# Patient Record
Sex: Male | Born: 1950 | Race: White | Hispanic: No | Marital: Married | Smoking: Never smoker
Health system: Southern US, Community
[De-identification: ages and names within clinical notes are randomized; demographics above are authoritative.]

## PROBLEM LIST (undated history)

## (undated) DIAGNOSIS — N200 Calculus of kidney: Secondary | ICD-10-CM

---

## 2002-07-01 ENCOUNTER — Encounter: Payer: Self-pay | Admitting: Urology

## 2002-07-01 ENCOUNTER — Ambulatory Visit (HOSPITAL_BASED_OUTPATIENT_CLINIC_OR_DEPARTMENT_OTHER): Admission: RE | Admit: 2002-07-01 | Discharge: 2002-07-01 | Payer: Self-pay | Admitting: Urology

## 2005-06-04 ENCOUNTER — Encounter: Admission: RE | Admit: 2005-06-04 | Discharge: 2005-06-04 | Payer: Self-pay | Admitting: Internal Medicine

## 2006-08-27 IMAGING — US US MD EVAL AND MANAGEMENT - NRPT [facility]
1 series · 13 of 16 positions shown · non-contrast
Comparison: none

[Series 1: unknown · 13 of 55 slices shown]
[im 1/55]
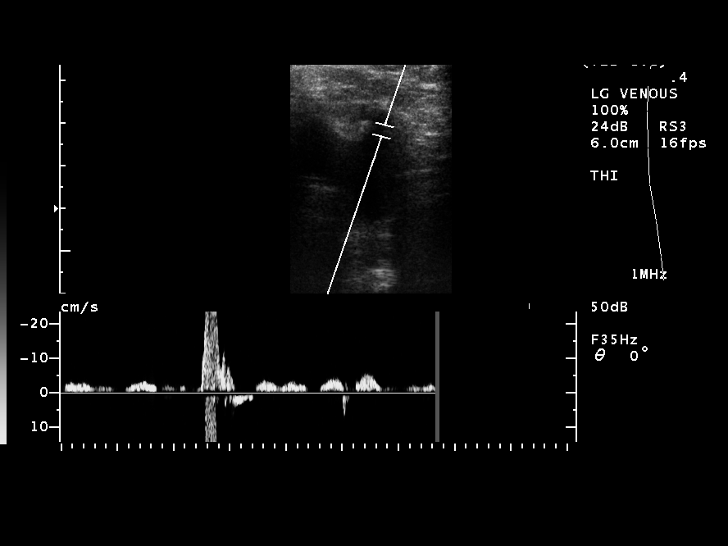
[im 4/55]
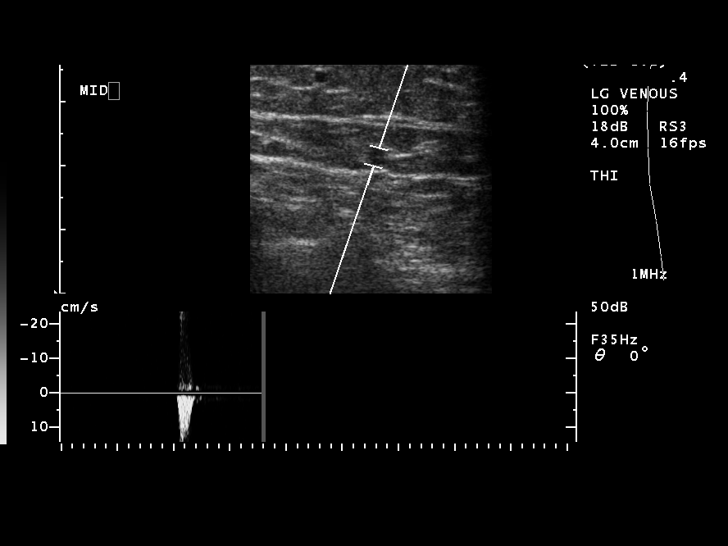
[im 11/55]
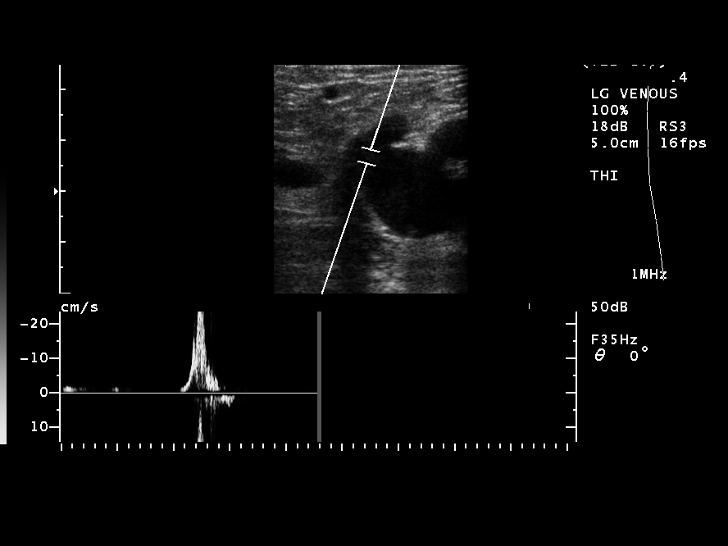
[im 15/55]
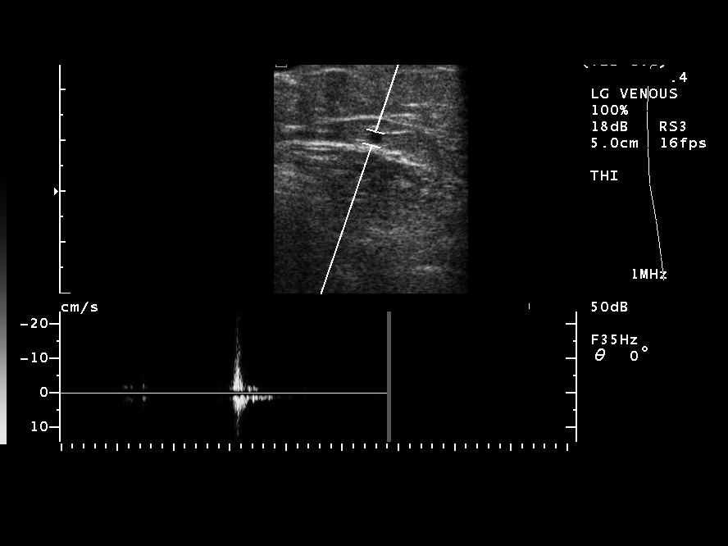
[im 19/55]
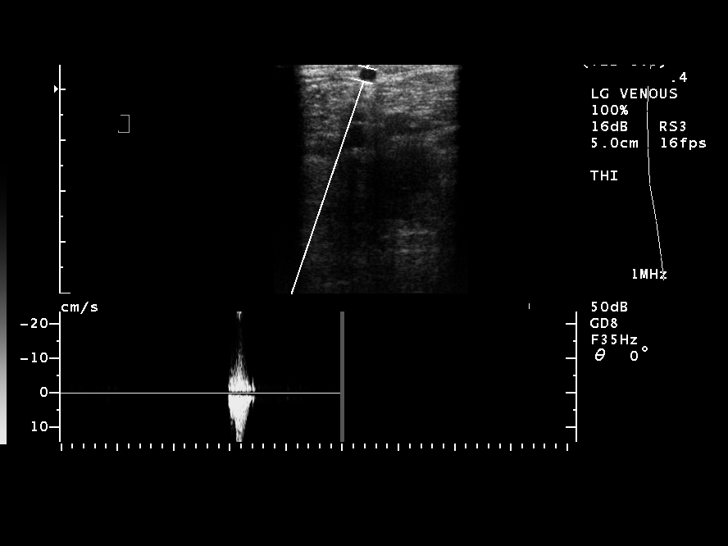
[im 22/55]
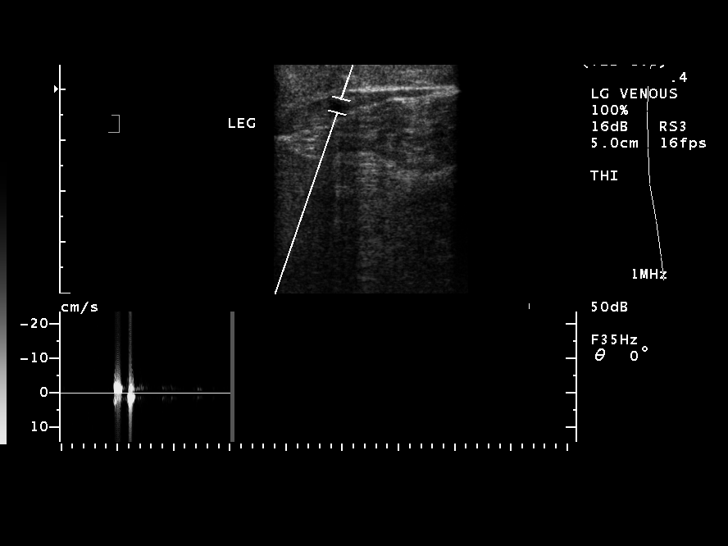
[im 29/55]
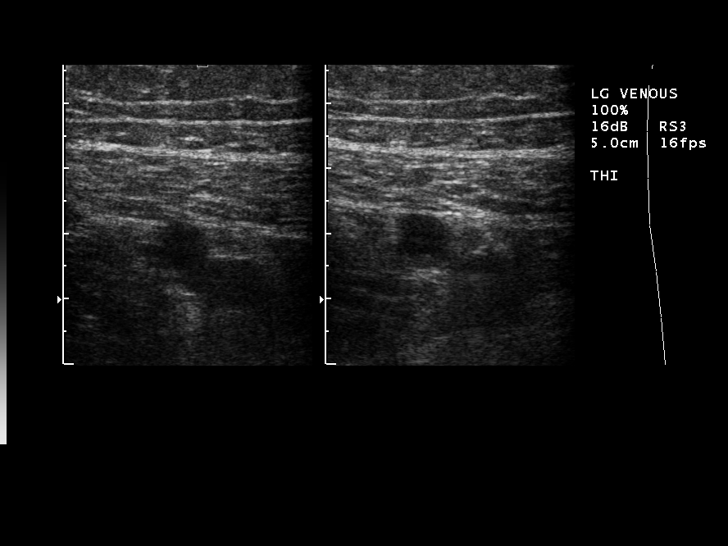
[im 33/55]
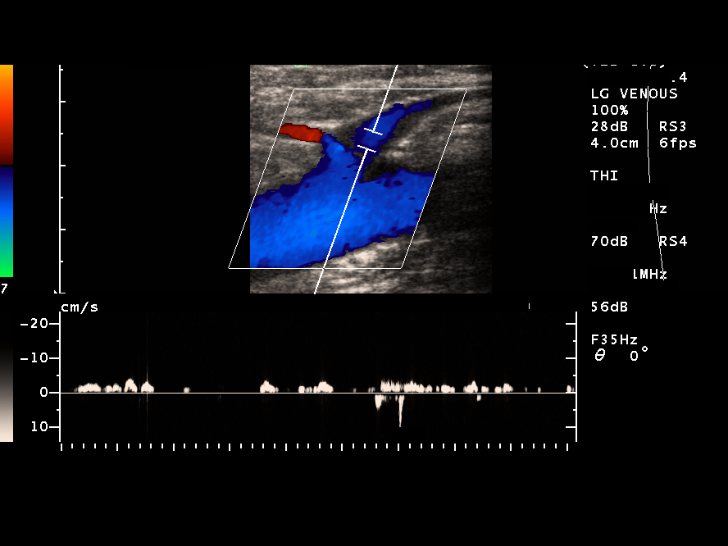
[im 37/55]
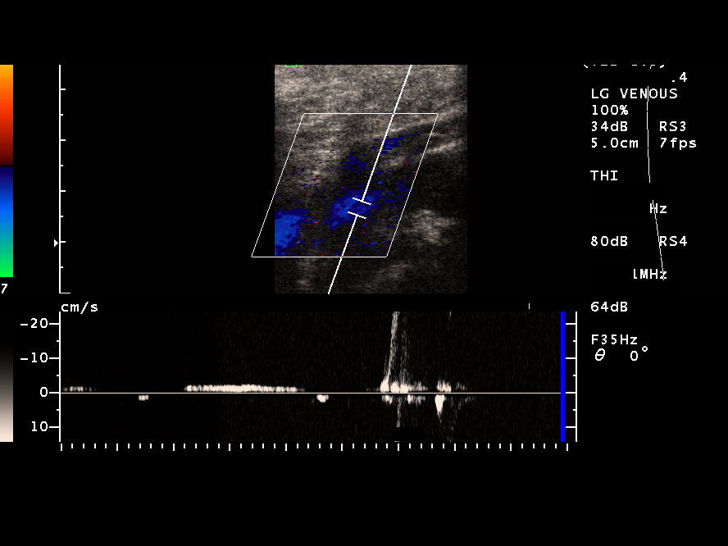
[im 40/55]
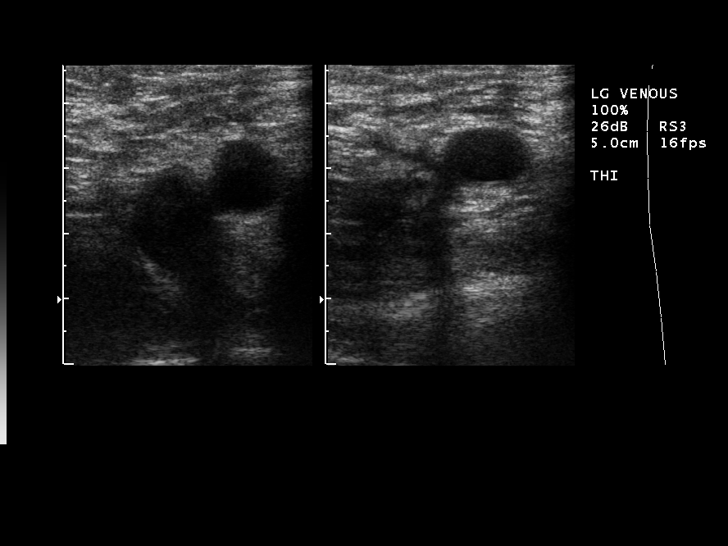
[im 44/55]
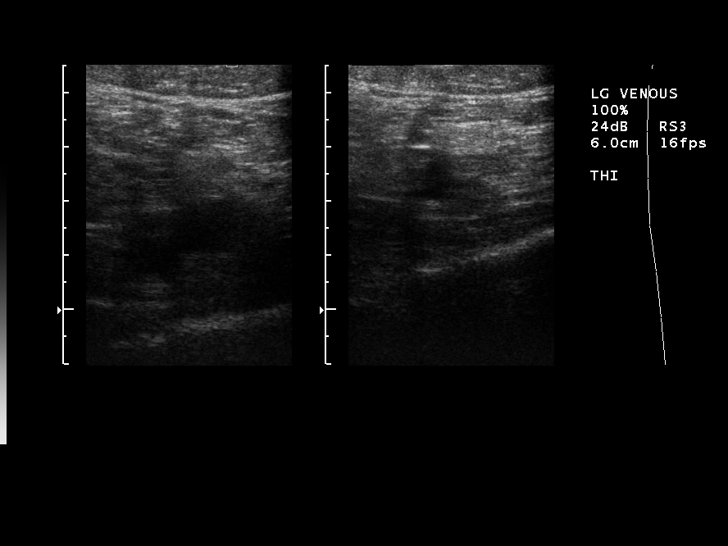
[im 51/55]
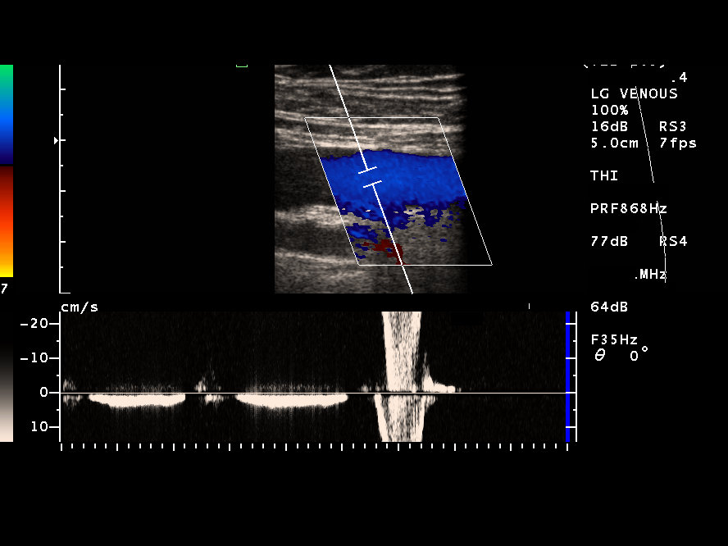
[im 55/55]
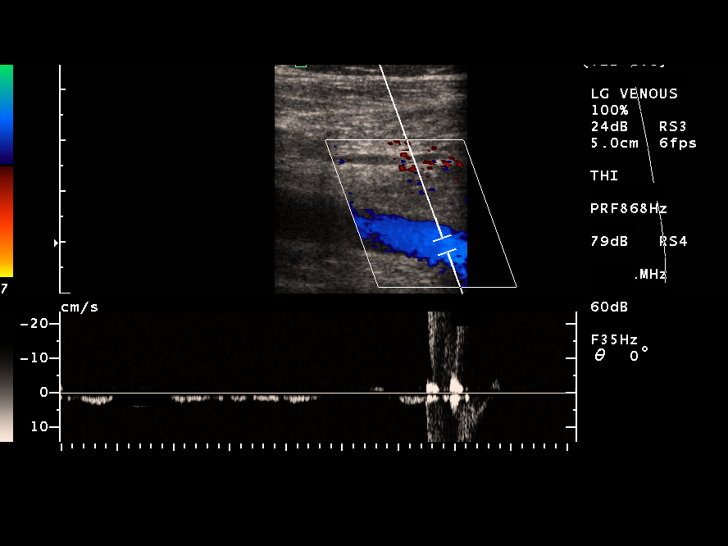

[13 of 16 positions shown; findings below may reference images not displayed]

OUTPATIENT CONSULTATION ? LEVEL II - [DATE]

 Dorys Graber, M.D.
 [REDACTED] [REDACTED]
 [address]

 RE:  Mattoussi Neguez (DOB ? 03/09/1951)

 Dear Dr. Gouwa Arend: 

 Thank you for your referral of Mr. Posavi to me for evaluation of possible treatment of lower extremity varicose veins.  As you know, Mrs. Abolore is a 54 year old male with a several year history of worsening varicose veins, lower extremity pain, and tiredness.  These symptoms are worse in the left leg.  Patient states that he does stand a lot for his job which worsens his symptoms, particularly in the medial distal left thigh and posterior to the left knee.  

 On physical exam, the patient does have moderate sized varicose veins within the medial left thigh and calf as well as posterior to the left knee.  Smaller varicose veins noted within the right medial calf and posterior to the right knee.  There are large bilateral spider veins within the medial thighs, calves, and around the ankles.  

 The patient underwent bilateral lower extremity venous ultrasound today.  This was dictated in a separate report.  In short, this shows truncal reflex only within a very short segment of right great saphenous vein below the right knee.  Otherwise no evidence of saphenous vein reflux.  The varicose veins in the left leg appear to arise from multiple separate perforators from the deep venous system.  

 I have discussed these findings in depth with the patient.  Due to the very limited truncal reflex, the patient is not a good candidate for endovenous laser ablation.  I did offer ultrasound guided sclerotherapy for the patient?s varicose veins, particularly in the left leg.  The patient wishes to hold off at this time.  If his symptoms worsen, he may opt for the treatment in the future.  I did give the patient a prescription for 20 ? 30mm Hg graduated compression hose which he may try in the near future. 

 Again, thank you for your referral of Mr. Abolore to me for evaluation for lower extremity venous insufficiency.  If I can be of further assistance please do not hesitate to contact me. 

 I spent approximately 30 minutes in direct consultation with the patient. 

 Abolore:Yashe, Funmi

## 2016-01-16 DIAGNOSIS — N200 Calculus of kidney: Secondary | ICD-10-CM | POA: Diagnosis not present

## 2016-01-16 DIAGNOSIS — R1031 Right lower quadrant pain: Secondary | ICD-10-CM | POA: Diagnosis not present

## 2016-04-23 DIAGNOSIS — H401212 Low-tension glaucoma, right eye, moderate stage: Secondary | ICD-10-CM | POA: Diagnosis not present

## 2016-04-23 DIAGNOSIS — H26493 Other secondary cataract, bilateral: Secondary | ICD-10-CM | POA: Diagnosis not present

## 2016-04-23 DIAGNOSIS — H401222 Low-tension glaucoma, left eye, moderate stage: Secondary | ICD-10-CM | POA: Diagnosis not present

## 2016-05-15 DIAGNOSIS — N200 Calculus of kidney: Secondary | ICD-10-CM | POA: Diagnosis not present

## 2016-05-15 DIAGNOSIS — N401 Enlarged prostate with lower urinary tract symptoms: Secondary | ICD-10-CM | POA: Diagnosis not present

## 2016-05-15 DIAGNOSIS — R3912 Poor urinary stream: Secondary | ICD-10-CM | POA: Diagnosis not present

## 2016-08-14 DIAGNOSIS — I10 Essential (primary) hypertension: Secondary | ICD-10-CM | POA: Diagnosis not present

## 2016-08-14 DIAGNOSIS — Z125 Encounter for screening for malignant neoplasm of prostate: Secondary | ICD-10-CM | POA: Diagnosis not present

## 2016-08-14 DIAGNOSIS — E784 Other hyperlipidemia: Secondary | ICD-10-CM | POA: Diagnosis not present

## 2016-08-14 DIAGNOSIS — E038 Other specified hypothyroidism: Secondary | ICD-10-CM | POA: Diagnosis not present

## 2016-08-21 DIAGNOSIS — Z6836 Body mass index (BMI) 36.0-36.9, adult: Secondary | ICD-10-CM | POA: Diagnosis not present

## 2016-08-21 DIAGNOSIS — N200 Calculus of kidney: Secondary | ICD-10-CM | POA: Diagnosis not present

## 2016-08-21 DIAGNOSIS — I10 Essential (primary) hypertension: Secondary | ICD-10-CM | POA: Diagnosis not present

## 2016-08-21 DIAGNOSIS — J342 Deviated nasal septum: Secondary | ICD-10-CM | POA: Diagnosis not present

## 2016-08-21 DIAGNOSIS — Z0000 Encounter for general adult medical examination without abnormal findings: Secondary | ICD-10-CM | POA: Diagnosis not present

## 2016-08-21 DIAGNOSIS — I868 Varicose veins of other specified sites: Secondary | ICD-10-CM | POA: Diagnosis not present

## 2016-08-21 DIAGNOSIS — E669 Obesity, unspecified: Secondary | ICD-10-CM | POA: Diagnosis not present

## 2016-08-21 DIAGNOSIS — E039 Hypothyroidism, unspecified: Secondary | ICD-10-CM | POA: Diagnosis not present

## 2016-08-21 DIAGNOSIS — R0989 Other specified symptoms and signs involving the circulatory and respiratory systems: Secondary | ICD-10-CM | POA: Diagnosis not present

## 2016-08-21 DIAGNOSIS — E785 Hyperlipidemia, unspecified: Secondary | ICD-10-CM | POA: Diagnosis not present

## 2016-11-05 DIAGNOSIS — H401232 Low-tension glaucoma, bilateral, moderate stage: Secondary | ICD-10-CM | POA: Diagnosis not present

## 2016-11-08 ENCOUNTER — Telehealth: Payer: Self-pay | Admitting: *Deleted

## 2016-11-08 NOTE — Telephone Encounter (Signed)
Notes sent to scheduling.   

## 2016-11-19 ENCOUNTER — Institutional Professional Consult (permissible substitution): Payer: Self-pay | Admitting: Internal Medicine

## 2016-12-30 ENCOUNTER — Encounter (HOSPITAL_COMMUNITY): Payer: Self-pay | Admitting: Internal Medicine

## 2016-12-30 ENCOUNTER — Observation Stay (HOSPITAL_COMMUNITY)
Admission: AD | Admit: 2016-12-30 | Discharge: 2017-01-01 | Disposition: A | Discharge status: Home / Self Care | Payer: Medicare Other | Source: Ambulatory Visit | Attending: Internal Medicine | Admitting: Internal Medicine

## 2016-12-30 DIAGNOSIS — Z87442 Personal history of urinary calculi: Secondary | ICD-10-CM | POA: Insufficient documentation

## 2016-12-30 DIAGNOSIS — Z8042 Family history of malignant neoplasm of prostate: Secondary | ICD-10-CM | POA: Diagnosis not present

## 2016-12-30 DIAGNOSIS — K253 Acute gastric ulcer without hemorrhage or perforation: Secondary | ICD-10-CM

## 2016-12-30 DIAGNOSIS — K5730 Diverticulosis of large intestine without perforation or abscess without bleeding: Secondary | ICD-10-CM | POA: Diagnosis not present

## 2016-12-30 DIAGNOSIS — F40240 Claustrophobia: Secondary | ICD-10-CM | POA: Insufficient documentation

## 2016-12-30 DIAGNOSIS — R0789 Other chest pain: Secondary | ICD-10-CM | POA: Diagnosis not present

## 2016-12-30 DIAGNOSIS — Z823 Family history of stroke: Secondary | ICD-10-CM | POA: Insufficient documentation

## 2016-12-30 DIAGNOSIS — K2950 Unspecified chronic gastritis without bleeding: Secondary | ICD-10-CM | POA: Diagnosis not present

## 2016-12-30 DIAGNOSIS — Z9842 Cataract extraction status, left eye: Secondary | ICD-10-CM | POA: Insufficient documentation

## 2016-12-30 DIAGNOSIS — Z833 Family history of diabetes mellitus: Secondary | ICD-10-CM | POA: Insufficient documentation

## 2016-12-30 DIAGNOSIS — K921 Melena: Secondary | ICD-10-CM | POA: Insufficient documentation

## 2016-12-30 DIAGNOSIS — K259 Gastric ulcer, unspecified as acute or chronic, without hemorrhage or perforation: Secondary | ICD-10-CM | POA: Diagnosis not present

## 2016-12-30 DIAGNOSIS — D509 Iron deficiency anemia, unspecified: Secondary | ICD-10-CM | POA: Insufficient documentation

## 2016-12-30 DIAGNOSIS — H409 Unspecified glaucoma: Secondary | ICD-10-CM | POA: Insufficient documentation

## 2016-12-30 DIAGNOSIS — I10 Essential (primary) hypertension: Secondary | ICD-10-CM | POA: Insufficient documentation

## 2016-12-30 DIAGNOSIS — R195 Other fecal abnormalities: Secondary | ICD-10-CM | POA: Diagnosis not present

## 2016-12-30 DIAGNOSIS — R030 Elevated blood-pressure reading, without diagnosis of hypertension: Secondary | ICD-10-CM | POA: Diagnosis not present

## 2016-12-30 DIAGNOSIS — F419 Anxiety disorder, unspecified: Secondary | ICD-10-CM | POA: Insufficient documentation

## 2016-12-30 DIAGNOSIS — H4020X0 Unspecified primary angle-closure glaucoma, stage unspecified: Secondary | ICD-10-CM | POA: Diagnosis not present

## 2016-12-30 DIAGNOSIS — D120 Benign neoplasm of cecum: Secondary | ICD-10-CM | POA: Diagnosis not present

## 2016-12-30 DIAGNOSIS — D123 Benign neoplasm of transverse colon: Secondary | ICD-10-CM

## 2016-12-30 DIAGNOSIS — D649 Anemia, unspecified: Secondary | ICD-10-CM | POA: Diagnosis present

## 2016-12-30 DIAGNOSIS — D124 Benign neoplasm of descending colon: Secondary | ICD-10-CM | POA: Diagnosis not present

## 2016-12-30 DIAGNOSIS — D6489 Other specified anemias: Secondary | ICD-10-CM | POA: Diagnosis not present

## 2016-12-30 DIAGNOSIS — Z9841 Cataract extraction status, right eye: Secondary | ICD-10-CM | POA: Insufficient documentation

## 2016-12-30 DIAGNOSIS — H3530 Unspecified macular degeneration: Secondary | ICD-10-CM | POA: Diagnosis not present

## 2016-12-30 DIAGNOSIS — R1030 Lower abdominal pain, unspecified: Secondary | ICD-10-CM | POA: Diagnosis not present

## 2016-12-30 DIAGNOSIS — Z79899 Other long term (current) drug therapy: Secondary | ICD-10-CM | POA: Diagnosis not present

## 2016-12-30 HISTORY — DX: Calculus of kidney: N20.0

## 2016-12-30 LAB — RETICULOCYTES
RBC.: 3.34 MIL/uL — AB (ref 4.22–5.81)
RETIC COUNT ABSOLUTE: 83.5 10*3/uL (ref 19.0–186.0)
Retic Ct Pct: 2.5 % (ref 0.4–3.1)

## 2016-12-30 LAB — IRON AND TIBC
Iron: 8 ug/dL — ABNORMAL LOW (ref 45–182)
Saturation Ratios: 1 % — ABNORMAL LOW (ref 17.9–39.5)
TIBC: 577 ug/dL — ABNORMAL HIGH (ref 250–450)
UIBC: 569 ug/dL

## 2016-12-30 LAB — PREPARE RBC (CROSSMATCH)

## 2016-12-30 LAB — CBC
HCT: 21.9 % — ABNORMAL LOW (ref 39.0–52.0)
HEMOGLOBIN: 6 g/dL — AB (ref 13.0–17.0)
MCH: 18 pg — ABNORMAL LOW (ref 26.0–34.0)
MCHC: 27.4 g/dL — AB (ref 30.0–36.0)
MCV: 65.6 fL — ABNORMAL LOW (ref 78.0–100.0)
PLATELETS: 363 10*3/uL (ref 150–400)
RBC: 3.34 MIL/uL — ABNORMAL LOW (ref 4.22–5.81)
RDW: 18.1 % — AB (ref 11.5–15.5)
WBC: 10.6 10*3/uL — ABNORMAL HIGH (ref 4.0–10.5)

## 2016-12-30 LAB — APTT: APTT: 32 s (ref 24–36)

## 2016-12-30 LAB — PROTIME-INR
INR: 1.13
PROTHROMBIN TIME: 14.5 s (ref 11.4–15.2)

## 2016-12-30 LAB — VITAMIN B12: Vitamin B-12: 311 pg/mL (ref 180–914)

## 2016-12-30 LAB — FERRITIN: FERRITIN: 4 ng/mL — AB (ref 24–336)

## 2016-12-30 LAB — FOLATE: Folate: 27.1 ng/mL (ref >5.9)

## 2016-12-30 MED ORDER — ACETAMINOPHEN 325 MG PO TABS: 650.0000 mg | ORAL_TABLET | Freq: Four times a day (QID) | ORAL | Status: DC | PRN

## 2016-12-30 MED ORDER — SODIUM CHLORIDE 0.9% FLUSH
3.0000 mL | Freq: Two times a day (BID) | INTRAVENOUS | Status: DC
  Administered 2016-12-30 – 2016-12-31 (×3): 3 mL via INTRAVENOUS

## 2016-12-30 MED ORDER — ONDANSETRON HCL 4 MG PO TABS: 4.0000 mg | ORAL_TABLET | Freq: Four times a day (QID) | ORAL | Status: DC | PRN

## 2016-12-30 MED ORDER — ONDANSETRON HCL 4 MG/2ML IJ SOLN: 4.0000 mg | Freq: Four times a day (QID) | INTRAMUSCULAR | Status: DC | PRN

## 2016-12-30 MED ORDER — ACETAMINOPHEN 325 MG PO TABS: 650.0000 mg | ORAL_TABLET | Freq: Once | ORAL | Status: DC

## 2016-12-30 MED ORDER — HYDROCODONE-ACETAMINOPHEN 5-325 MG PO TABS: 1.0000 | ORAL_TABLET | ORAL | Status: DC | PRN

## 2016-12-30 MED ORDER — DIPHENHYDRAMINE HCL 25 MG PO CAPS
25.0000 mg | ORAL_CAPSULE | Freq: Once | ORAL | Status: AC
  Administered 2016-12-30: 25 mg via ORAL
  Filled 2016-12-30: qty 1

## 2016-12-30 MED ORDER — SODIUM CHLORIDE 0.9 % IV SOLN
INTRAVENOUS | Status: AC
  Administered 2016-12-30: 22:00:00 via INTRAVENOUS

## 2016-12-30 MED ORDER — ACETAMINOPHEN 650 MG RE SUPP: 650.0000 mg | Freq: Four times a day (QID) | RECTAL | Status: DC | PRN

## 2016-12-30 MED ORDER — SODIUM CHLORIDE 0.9 % IV SOLN
Freq: Once | INTRAVENOUS | Status: AC
  Administered 2016-12-30: 22:00:00 via INTRAVENOUS

## 2016-12-30 NOTE — H&P (Signed)
Paul Sellers ZHG:[id number] DOB: [birth year] DOA: 12/30/2016     PCP: Dr. Brigitte Pulse  Outpatient Specialists: none Patient coming from home Lives  With family   Chief Complaint: abnormal labs  HPI: Paul Sellers is a 66 y.o. male with medical history significant of kidney stones    Presented with progressive shortness of breath the past 6-8 weeks he has had some intermittent abdominal pain but denies any black stools or blood in stools. He presented to his primary care provider today and was noted to have hemoglobin down to 5.8 from 12 in November. Per report his chest x-ray and EKG were unremarkable blood pressure in the office today 112/80 heart rate 71 Hemoccult was positive brown stool The pCP arranged for direct admit to [facility] plan for blood transfusion and GI workup  reports only one episode of green/dark.black stool 1 week  ago. Today only wife noted some Yellow tinge to his skin that had occurred in afternoon. Reports he has been pale for weeks  Hospitalist was called for admission for symptomatic anemia He was prescribed int he past HCTZ fot HTN but never took it.  Review of Systems:    Pertinent positives include:  fatigue, abdominal pain  Constitutional:  No weight loss, night sweats, Fevers, chills, weight loss  HEENT:  No headaches, Difficulty swallowing,Tooth/dental problems,Sore throat,  No sneezing, itching, ear ache, nasal congestion, post nasal drip,  Cardio-vascular:  No chest pain, Orthopnea, PND, anasarca, dizziness, palpitations.no Bilateral lower extremity swelling  GI:  No heartburn, indigestion,, nausea, vomiting, diarrhea, change in bowel habits, loss of appetite, melena, blood in stool, hematemesis Resp:  no shortness of breath at rest. No dyspnea on exertion, No excess mucus, no productive cough, No non-productive cough, No coughing up of blood.No change in color of mucus.No wheezing. Skin:  no rash or lesions. No jaundice GU:  no dysuria, change  in color of urine, no urgency or frequency. No straining to urinate.  No flank pain.  Musculoskeletal:  No joint pain or no joint swelling. No decreased range of motion. No back pain.  Psych:  No change in mood or affect. No depression or anxiety. No memory loss.  Neuro: no localizing neurological complaints, no tingling, no weakness, no double vision, no gait abnormality, no slurred speech, no confusion  As per HPI otherwise 10 point review of systems negative.   Past Medical History: History reviewed. No pertinent past medical history. History reviewed. No pertinent surgical history.   Social History:  Ambulatory independently   has no tobacco, alcohol, and drug history on file.  Allergies:  No Known Allergies     Family History:   Family History  Problem Relation Age of Onset  . Stroke Father   . Prostate cancer Father   . Diabetes Sister   . CAD Neg Hx   . Hypertension Neg Hx     Medications: Prior to Admission medications   Not on File    Physical Exam: No data found.   1. General:  in No Acute distress 2. Psychological: Alert and  Oriented 3. Head/ENT:     Dry Mucous Membranes                          Head Non traumatic, neck supple                          Normal  Dentition 4. SKIN:  decreased Skin turgor,  Skin clean Dry and intact no rash, slight icterus notes 5. Heart: Regular rate and rhythm no Murmur, Rub or gallop 6. Lungs:  Clear to auscultation bilaterally, no wheezes or crackles   7. Abdomen: Soft,  non-tender, Non distended 8. Lower extremities: no clubbing, cyanosis, or edema 9. Neurologically Grossly intact, moving all 4 extremities equally  10. MSK: Normal range of motion   body mass index is unknown because there is no height or weight on file.  Labs on Admission:   Labs on Admission: I have personally reviewed following labs and imaging studies  CBC:  Recent Labs Lab 12/30/16 2102  WBC 10.6*  HGB 6.0*  HCT 21.9*  MCV  65.6*  PLT 675   Basic Metabolic Panel: No results for input(s): NA, K, CL, CO2, GLUCOSE, BUN, CREATININE, CALCIUM, MG, PHOS in the last 168 hours. GFR: CrCl cannot be calculated (No order found.). Liver Function Tests: No results for input(s): AST, ALT, ALKPHOS, BILITOT, PROT, ALBUMIN in the last 168 hours. No results for input(s): LIPASE, AMYLASE in the last 168 hours. No results for input(s): AMMONIA in the last 168 hours. Coagulation Profile: No results for input(s): INR, PROTIME in the last 168 hours. Cardiac Enzymes: No results for input(s): CKTOTAL, CKMB, CKMBINDEX, TROPONINI in the last 168 hours. BNP (last 3 results) No results for input(s): PROBNP in the last 8760 hours. HbA1C: No results for input(s): HGBA1C in the last 72 hours. CBG: No results for input(s): GLUCAP in the last 168 hours. Lipid Profile: No results for input(s): CHOL, HDL, LDLCALC, TRIG, CHOLHDL, LDLDIRECT in the last 72 hours. Thyroid Function Tests: No results for input(s): TSH, T4TOTAL, FREET4, T3FREE, THYROIDAB in the last 72 hours. Anemia Panel: No results for input(s): VITAMINB12, FOLATE, FERRITIN, TIBC, IRON, RETICCTPCT in the last 72 hours. Urine analysis: No results found for: COLORURINE, APPEARANCEUR, LABSPEC, PHURINE, GLUCOSEU, HGBUR, BILIRUBINUR, KETONESUR, PROTEINUR, UROBILINOGEN, NITRITE, LEUKOCYTESUR Sepsis Labs: @LABRCNTIP (procalcitonin:4,lacticidven:4) )No results found for this or any previous visit (from the past 240 hour(s)).     UA not ordered  No results found for: HGBA1C  CrCl cannot be calculated (No order found.).  BNP (last 3 results) No results for input(s): PROBNP in the last 8760 hours.   ECG REPORT Not ordered  There were no vitals filed for this visit.   Cultures: No results found for: [place], [place], [place], REPTSTATUS   Radiological Exams on Admission: No results found.  Chart has been reviewed    Assessment/Plan   66 y.o. male with medical  history significant of kidney stones, glaucoma  and untreated elevated BP admitted for symptomatic anemia and Hemoccult positive stools  Present on Admission: . Symptomatic anemia - with hemoccult positive stools , transfuse 2 units continue to follow serial CBC the anemia panel, Significant iron deficiency may need IV iron prior to discahrge . Occult blood in stools - spoke to Gi will see in AM, Protonix IV nothing by mouth post midnight. Serial CBC Elevated BP continue to follow will need to follow up with PCP Glaucoma - continue home medications Other plan as per orders.  DVT prophylaxis:  SCD   Code Status:  FULL CODE as per patient   Family Communication:   Family not  at  Bedside    Disposition Plan:    To home once workup is complete and patient is stable  Consults called: [facility] spoke to dr. Henrene Pastor   Admission status: obs  Level of care     tele        I have spent a total of 56 min on this admission   Ajay Strubel 12/31/2016, 12:39 AM    [facility]  Pager [phone]   after 2 AM please page floor coverage PA If 7AM-7PM, please contact the day team taking care of the patient  [email or web address]  Password [email or web address]

## 2016-12-31 ENCOUNTER — Encounter (HOSPITAL_COMMUNITY): Payer: Self-pay | Admitting: Internal Medicine

## 2016-12-31 DIAGNOSIS — R195 Other fecal abnormalities: Secondary | ICD-10-CM | POA: Diagnosis not present

## 2016-12-31 DIAGNOSIS — K921 Melena: Secondary | ICD-10-CM | POA: Diagnosis not present

## 2016-12-31 DIAGNOSIS — K2950 Unspecified chronic gastritis without bleeding: Secondary | ICD-10-CM | POA: Diagnosis not present

## 2016-12-31 DIAGNOSIS — D649 Anemia, unspecified: Secondary | ICD-10-CM | POA: Diagnosis not present

## 2016-12-31 DIAGNOSIS — D120 Benign neoplasm of cecum: Secondary | ICD-10-CM | POA: Diagnosis not present

## 2016-12-31 DIAGNOSIS — D500 Iron deficiency anemia secondary to blood loss (chronic): Secondary | ICD-10-CM | POA: Diagnosis not present

## 2016-12-31 DIAGNOSIS — K259 Gastric ulcer, unspecified as acute or chronic, without hemorrhage or perforation: Secondary | ICD-10-CM | POA: Diagnosis not present

## 2016-12-31 DIAGNOSIS — D124 Benign neoplasm of descending colon: Secondary | ICD-10-CM | POA: Diagnosis not present

## 2016-12-31 DIAGNOSIS — D509 Iron deficiency anemia, unspecified: Secondary | ICD-10-CM | POA: Diagnosis not present

## 2016-12-31 DIAGNOSIS — H409 Unspecified glaucoma: Secondary | ICD-10-CM | POA: Diagnosis not present

## 2016-12-31 DIAGNOSIS — Z87442 Personal history of urinary calculi: Secondary | ICD-10-CM | POA: Diagnosis not present

## 2016-12-31 DIAGNOSIS — K5730 Diverticulosis of large intestine without perforation or abscess without bleeding: Secondary | ICD-10-CM | POA: Diagnosis not present

## 2016-12-31 DIAGNOSIS — D123 Benign neoplasm of transverse colon: Secondary | ICD-10-CM | POA: Diagnosis not present

## 2016-12-31 DIAGNOSIS — K253 Acute gastric ulcer without hemorrhage or perforation: Secondary | ICD-10-CM | POA: Diagnosis not present

## 2016-12-31 LAB — COMPREHENSIVE METABOLIC PANEL
ALK PHOS: 66 U/L (ref 38–126)
ALT: 16 U/L — AB (ref 17–63)
ANION GAP: 8 (ref 5–15)
AST: 19 U/L (ref 15–41)
Albumin: 3.5 g/dL (ref 3.5–5.0)
BILIRUBIN TOTAL: 1.2 mg/dL (ref 0.3–1.2)
BUN: 13 mg/dL (ref 6–20)
CALCIUM: 8.5 mg/dL — AB (ref 8.9–10.3)
CO2: 26 mmol/L (ref 22–32)
CREATININE: 1.1 mg/dL (ref 0.61–1.24)
Chloride: 108 mmol/L (ref 101–111)
Glucose, Bld: 95 mg/dL (ref 65–99)
Potassium: 3.9 mmol/L (ref 3.5–5.1)
SODIUM: 142 mmol/L (ref 135–145)
TOTAL PROTEIN: 5.7 g/dL — AB (ref 6.5–8.1)

## 2016-12-31 LAB — CBC
HCT: 25.9 % — ABNORMAL LOW (ref 39.0–52.0)
HCT: 27.7 % — ABNORMAL LOW (ref 39.0–52.0)
HEMOGLOBIN: 7.6 g/dL — AB (ref 13.0–17.0)
HEMOGLOBIN: 8.3 g/dL — AB (ref 13.0–17.0)
MCH: 20.5 pg — AB (ref 26.0–34.0)
MCH: 21 pg — AB (ref 26.0–34.0)
MCHC: 29.3 g/dL — AB (ref 30.0–36.0)
MCHC: 30 g/dL (ref 30.0–36.0)
MCV: 70 fL — AB (ref 78.0–100.0)
MCV: 70.1 fL — AB (ref 78.0–100.0)
PLATELETS: 330 10*3/uL (ref 150–400)
Platelets: 321 10*3/uL (ref 150–400)
RBC: 3.7 MIL/uL — ABNORMAL LOW (ref 4.22–5.81)
RBC: 3.95 MIL/uL — AB (ref 4.22–5.81)
RDW: 20.9 % — ABNORMAL HIGH (ref 11.5–15.5)
RDW: 20.9 % — ABNORMAL HIGH (ref 11.5–15.5)
WBC: 8.1 10*3/uL (ref 4.0–10.5)
WBC: 8.7 10*3/uL (ref 4.0–10.5)

## 2016-12-31 LAB — TSH: TSH: 5.191 u[IU]/mL — ABNORMAL HIGH (ref 0.350–4.500)

## 2016-12-31 LAB — ABO/RH: ABO/RH(D): O POS

## 2016-12-31 LAB — PHOSPHORUS: Phosphorus: 3.2 mg/dL (ref 2.5–4.6)

## 2016-12-31 LAB — MAGNESIUM: MAGNESIUM: 2 mg/dL (ref 1.7–2.4)

## 2016-12-31 MED ORDER — PEG-KCL-NACL-NASULF-NA ASC-C 100 G PO SOLR
0.5000 | Freq: Once | ORAL | Status: AC
Start: 2016-12-31 — End: 2016-12-31
  Administered 2016-12-31: 100 g via ORAL
  Filled 2016-12-31: qty 1

## 2016-12-31 MED ORDER — PANTOPRAZOLE SODIUM 40 MG PO TBEC
40.0000 mg | DELAYED_RELEASE_TABLET | Freq: Every day | ORAL | Status: DC
  Administered 2017-01-01: 40 mg via ORAL
  Filled 2016-12-31: qty 1

## 2016-12-31 MED ORDER — PANTOPRAZOLE SODIUM 40 MG IV SOLR
40.0000 mg | Freq: Two times a day (BID) | INTRAVENOUS | Status: DC
  Administered 2016-12-31 (×2): 40 mg via INTRAVENOUS
  Filled 2016-12-31 (×2): qty 40

## 2016-12-31 MED ORDER — METOCLOPRAMIDE HCL 5 MG/ML IJ SOLN
10.0000 mg | Freq: Once | INTRAMUSCULAR | Status: AC
  Administered 2017-01-01: 10 mg via INTRAVENOUS
  Filled 2016-12-31: qty 2

## 2016-12-31 MED ORDER — BISACODYL 5 MG PO TBEC
10.0000 mg | DELAYED_RELEASE_TABLET | Freq: Once | ORAL | Status: AC
  Administered 2016-12-31: 10 mg via ORAL
  Filled 2016-12-31: qty 2

## 2016-12-31 MED ORDER — BISACODYL 5 MG PO TBEC
10.0000 mg | DELAYED_RELEASE_TABLET | Freq: Once | ORAL | Status: DC
  Filled 2016-12-31: qty 2

## 2016-12-31 MED ORDER — TIMOLOL MALEATE 0.5 % OP SOLN
1.0000 [drp] | Freq: Every day | OPHTHALMIC | Status: DC
Start: 2016-12-31 — End: 2017-01-01
  Filled 2016-12-31: qty 5

## 2016-12-31 MED ORDER — PEG-KCL-NACL-NASULF-NA ASC-C 100 G PO SOLR: 1.0000 | Freq: Once | ORAL | Status: DC

## 2016-12-31 MED ORDER — PEG-KCL-NACL-NASULF-NA ASC-C 100 G PO SOLR
0.5000 | Freq: Once | ORAL | Status: AC
  Administered 2017-01-01: 100 g via ORAL

## 2016-12-31 MED ORDER — METOCLOPRAMIDE HCL 5 MG/ML IJ SOLN
10.0000 mg | Freq: Once | INTRAMUSCULAR | Status: AC
  Administered 2016-12-31: 10 mg via INTRAVENOUS
  Filled 2016-12-31: qty 2

## 2016-12-31 NOTE — Progress Notes (Signed)
Pt was direct admitted fully alert and oriented self introduce to pt and wife, ID bracelet checked, fall prevention plan discussed with pt, he refused bed alarm to be turned on, monitor hooked on treatment plan discussed with pt, call light and phone within reach and pt able to demonstrate how to use them will continue to monitor

## 2016-12-31 NOTE — Care Management Note (Signed)
Case Management Note  Patient Details  Name: Paul Sellers MRN: [id number] Date of Birth: 1950/11/23  Subjective/Objective:             Presents with symptomatic anemia, hx of kidney stones. From home with wife Paul Sellers. Independent with ADL's. No DME usage.      Paul Sellers     [id number]       PCP: Dr. Brigitte Pulse / [facility]  Action/Plan: GI consult  pending....... CM to f/u with dioposition needs.  Expected Discharge Date:                  Expected Discharge Plan:  Home/Self Care  In-House Referral:     Discharge planning Services  CM Consult  Post Acute Care Choice:    Choice offered to:  Patient  DME Arranged:    DME Agency:     HH Arranged:    [place] Agency:     Status of Service:  In process, will continue to follow  If discussed at Long Length of Stay Meetings, dates discussed:    Additional Comments:  Sharin Mons, RN 12/31/2016, 11:46 AM

## 2016-12-31 NOTE — Progress Notes (Signed)
Pt refused his scheduled tylenol and SCDS pt said he has  medicare will send some bill to him to pay and doesn't want his bill to be high so he will refuse what he thinks he doesn't need it,  The need for SCDS to be put on has been explained to pt, will continue to monitor

## 2016-12-31 NOTE — Progress Notes (Signed)
Patient and his wife very upset about staying and waiting for GI consult ( he said it is a waist of time). He is also complaining of not being able to eat regular food. At this time GI NP was called and she said the patient is on her list for today and she will come to talk with him as soon as she can. MD made aware. Will continue to monitor.

## 2016-12-31 NOTE — Consult Note (Signed)
[facility] Consult: 1:21 PM 12/31/2016  LOS: 1 day    Referring Provider: Dr Lonny Prude  Primary Care Physician:  Dr Marton Redwood at [facility] Primary Gastroenterologist:  unassigned     Reason for Consultation:  Anemia.     HPI: Paul Sellers is a 66 y.o. male.  PMH Kidney stones. s/p cystoscopy, lithotripsy.  s/p ORIF of ankle fracture.  Glaucoma, macular degeneration, s/p bil cataract surgery.  Has declined all suggestions to pursue colonoscopy in past, never submitted FOBT tests.  Fearful of testing due to claustrophobia.   2 weeks of upper abdominal pain, not exacerbated by activity or food.  + fatigue, + DOE.   No change in BMs, no blood PR or melena.  Tried gas ex, Ibuprofen (only once) which did not help.  Appetite and food tolerance preserved.  A couple of episodes of nausea, bilious emesis 3 days ago. No ETOH.  Uses goodies powders.  No PPI or H2 blocker. Started taking the [place] on his own, has never been Rxd iron or told he was anemic.   Saw PMD yesterday and sent to ED with anemia and brown, FOBT + stool.  Hgb 6. Up to 7.6 after PRBC x 2.  MCV 65.  Ferritin is 4, iron is 8.  Chemistries unremarkable. Coags normal.  TSH 5.1.  No GI imaging performed so far.     Past Medical History:  Diagnosis Date  . Kidney stones     History reviewed. No pertinent surgical history.  Prior to Admission medications   Medication Sig Start Date End Date Taking? Authorizing Provider  Iron-Vitamins (GERITOL PO) Take 1 capsule by mouth daily.   Yes Historical Provider, MD  oxymetazoline (VICKS SINEX SEVERE DECONGEST) 0.05 % nasal spray Place 1 spray into both nostrils 2 (two) times daily as needed for congestion.   Yes Historical Provider, MD  potassium citrate (UROCIT-K) 10 MEQ (1080 MG) SR tablet  Take 10 mEq by mouth 2 (two) times daily between meals.   Yes Historical Provider, MD  timolol (TIMOPTIC) 0.5 % ophthalmic solution Place 1 drop into both eyes daily.   Yes Historical Provider, MD    Scheduled Meds: . acetaminophen  650 mg Oral Once  . pantoprazole (PROTONIX) IV  40 mg Intravenous Q12H  . sodium chloride flush  3 mL Intravenous Q12H  . timolol  1 drop Both Eyes Daily   Infusions:  PRN Meds: acetaminophen **OR** acetaminophen, HYDROcodone-acetaminophen, ondansetron **OR** ondansetron (ZOFRAN) IV   Allergies as of 12/30/2016  . (No Known Allergies)    Family History  Problem Relation Age of Onset  . Stroke Father   . Prostate cancer Father   . Diabetes Sister   . CAD Neg Hx   . Hypertension Neg Hx     Social History   Social History  . Marital status: Married    Spouse name: N/A  . Number of children: N/A  . Years of education: N/A   Occupational History  . Not on file.   Social History Main Topics  .  Smoking status: Never Smoker  . Smokeless tobacco: Never Used  . Alcohol use No  . Drug use: No  . Sexual activity: Not on file   Other Topics Concern  . Not on file   Social History Narrative  . No narrative on file    REVIEW OF SYSTEMS: Constitutional:  No weakness or fatigue.  Took a hard fall onto his right side 6 months ago, bruising healed long ago.  Generally no gait disturbance.  ENT:  No nose bleeds Pulm:  No SOB or cough CV:  No palpitations, no LE edema.  GU:  No hematuria, no frequency GI:  Per HPI.  No dysphagia Heme:  No unusual bleeding or bruising   Transfusions:  None in past Neuro:  No headaches, no peripheral tingling or numbness Derm:  No itching, no rash or sores.  Endocrine:  No sweats or chills.  No polyuria or dysuria Immunization:  Not queried Travel:  None beyond local counties in last few months.    PHYSICAL EXAM: Vital signs in last 24 hours: Vitals:   12/31/16 0436 12/31/16 0523  BP: (!) 120/54 (!)  118/53  Pulse: 67 68  Resp: 18 16  Temp: 98 F (36.7 C) 98.1 F (36.7 C)   Wt Readings from Last 3 Encounters:  12/30/16 93.5 kg (206 lb 1.6 oz)    General: anxious, looks well.  comfortable Head:  No asymmetry, swelling or trauma  Eyes:  No icterus or pallor Ears:  Not HOH  Nose:  No congestion or discharge Mouth:  Good teeth.  Clear mucosa.  Good teeth Neck:  No mass, no TMG Lungs:  Clear bil.   Heart: RRR.  No mrg.  s1, s2 present Abdomen:  Soft, obese, NT.  Active BS.  No fluid wave.  No HSM.  Small upper midline muscle diastasis.   Rectal: not repeated.  FOBT + in MD office yesterday   Musc/Skeltl: no joint swelling, deformities or redness Extremities:  No CCE  Neurologic:  Alert, oriented x 3.  No tremor.  No limb weakness Skin:  No telangectasia, rash or sores Tattoos:  none Nodes:  No cervical adenopathy   Psych:  Anxious, cooperative  LAB RESULTS:  Recent Labs  12/30/16 2102 12/31/16 0837  WBC 10.6* 8.1  HGB 6.0* 7.6*  HCT 21.9* 25.9*  PLT 363 330   BMET Lab Results  Component Value Date   NA 142 12/31/2016   K 3.9 12/31/2016   CL 108 12/31/2016   CO2 26 12/31/2016   GLUCOSE 95 12/31/2016   BUN 13 12/31/2016   CREATININE 1.10 12/31/2016   CALCIUM 8.5 (L) 12/31/2016   LFT  Recent Labs  12/31/16 0837  PROT 5.7*  ALBUMIN 3.5  AST 19  ALT 16*  ALKPHOS 66  BILITOT 1.2   PT/INR Lab Results  Component Value Date   INR 1.13 12/30/2016    IMPRESSION:   *  FOBT +, iron def anemia.  Rule out neoplasia of colon or upper GI tract.  r/o ulcer, r/o AVMs.  No previous colonoscopy or EGD s/p PRBC x 2 with good response.   *  Anxiety, claustrophobia.      PLAN:     *  Colonoscopy and EGD hopefully tomorrow.     Azucena Freed  12/31/2016, 1:21 PM Pager: [phone]  ________________________________________________________________________  Rudolpho Sevin MD note:  I personally examined the patient, reviewed the data and agree with the  assessment and plan described above.  Iron def, FOBT +  significant anemia.  Planning on colonoscopy +/- EGD tomorrow.     Owens Loffler, MD [facility] Pager [phone]

## 2016-12-31 NOTE — Progress Notes (Signed)
PROGRESS NOTE    Paul DURFLINGER  LNL:[id number] DOB: [birth year] DOA: 12/30/2016 PCP: No PCP Per Patient   Brief Narrative: Paul Sellers is a 66 y.o. male with a significant medical history including kidney stones. He presented with symptomatic anemia.   Assessment & Plan:   Active Problems:   Symptomatic anemia   Occult blood in stools   Symptomatic anemia Iron deficiency anemia He had two units of blood with improvement of hemoglobin to 8.3. Symptoms improved. Patient with a positive hemoccult at his primary physician's office. No recurrent bleeding so far. Patient has never had a colonoscopy. -GI recommendations: PPI, will see today   DVT prophylaxis: SCDs Code Status: Full code Family Communication: Wife at bedside Disposition Plan: Discharge likely tomorrow after GI intervention   Consultants:   [facility]  Procedures:   PRBC  Antimicrobials:  None    Subjective: Patient reports no symptoms of lightheadedness today. No bowel movement.  Objective: Vitals:   12/31/16 0224 12/31/16 0258 12/31/16 0436 12/31/16 0523  BP: (!) 116/49 126/66 (!) 120/54 (!) 118/53  Pulse: 72 72 67 68  Resp: _0 Temp: 98.6 F (37 C) 98.7 F (37.1 C) 98 F (36.7 C) 98.1 F (36.7 C)  TempSrc: Oral Oral  Oral  SpO2: 99% 100% 100% 100%  Weight:      Height:        Intake/Output Summary (Last 24 hours) at 12/31/16 1454 Last data filed at 12/31/16 0657  Gross per 24 hour  Intake             1963 ml  Output                1 ml  Net             1962 ml   Filed Weights   12/30/16 2058  Weight: 93.5 kg (206 lb 1.6 oz)    Examination:  General exam: Appears calm and comfortable Respiratory system: Clear to auscultation. Respiratory effort normal. Cardiovascular system: S1 & S2 heard, RRR. No murmurs Gastrointestinal system: Abdomen is nondistended, soft and nontender. Normal bowel sounds heard. Central nervous system: Alert and oriented. No  focal neurological deficits. Extremities: No edema. No calf tenderness Skin: No cyanosis. No rashes Psychiatry: Judgement and insight appear normal. Mood & affect appropriate.     Data Reviewed: I have personally reviewed following labs and imaging studies  CBC:  Recent Labs Lab 12/30/16 2102 12/31/16 0837 12/31/16 1403  WBC 10.6* 8.1 8.7  HGB 6.0* 7.6* 8.3*  HCT 21.9* 25.9* 27.7*  MCV 65.6* 70.0* 70.1*  PLT 363 330 408   Basic Metabolic Panel:  Recent Labs Lab 12/31/16 0837  NA 142  K 3.9  CL 108  CO2 26  GLUCOSE 95  BUN 13  CREATININE 1.10  CALCIUM 8.5*  MG 2.0  PHOS 3.2   GFR: Estimated Creatinine Clearance: 69.8 mL/min (by C-G formula based on SCr of 1.1 mg/dL). Liver Function Tests:  Recent Labs Lab 12/31/16 0837  AST 19  ALT 16*  ALKPHOS 66  BILITOT 1.2  PROT 5.7*  ALBUMIN 3.5   No results for input(s): LIPASE, AMYLASE in the last 168 hours. No results for input(s): AMMONIA in the last 168 hours. Coagulation Profile:  Recent Labs Lab 12/30/16 2102  INR 1.13   Cardiac Enzymes: No results for input(s): CKTOTAL, CKMB, CKMBINDEX, TROPONINI in the last 168 hours. BNP (last 3 results) No results for input(s): PROBNP in the last  8760 hours. HbA1C: No results for input(s): HGBA1C in the last 72 hours. CBG: No results for input(s): GLUCAP in the last 168 hours. Lipid Profile: No results for input(s): CHOL, HDL, LDLCALC, TRIG, CHOLHDL, LDLDIRECT in the last 72 hours. Thyroid Function Tests:  Recent Labs  12/31/16 0827  TSH 5.191*   Anemia Panel:  Recent Labs  12/30/16 2102  VITAMINB12 311  FOLATE 27.1  FERRITIN 4*  TIBC 577*  IRON 8*  RETICCTPCT 2.5   Sepsis Labs: No results for input(s): PROCALCITON, LATICACIDVEN in the last 168 hours.  No results found for this or any previous visit (from the past 240 hour(s)).       Radiology Studies: No results found.      Scheduled Meds: . acetaminophen  650 mg Oral Once  .  bisacodyl  10 mg Oral Once   Followed by  . [START ON 01/01/2017] bisacodyl  10 mg Oral Once  . metoCLOPramide (REGLAN) injection  10 mg Intravenous Once   Followed by  . [START ON 01/01/2017] metoCLOPramide (REGLAN) injection  10 mg Intravenous Once  . [START ON 01/01/2017] pantoprazole  40 mg Oral Q0600  . peg 3350 powder  0.5 kit Oral Once   Followed by  . [START ON 01/01/2017] peg 3350 powder  0.5 kit Oral Once  . sodium chloride flush  3 mL Intravenous Q12H  . timolol  1 drop Both Eyes Daily   Continuous Infusions:   LOS: 1 day     Cordelia Poche, MD [facility] 12/31/2016, 2:54 PM Pager: [phone]  If 7PM-7AM, please contact night-coverage [email or web address] Password [email or web address] 12/31/2016, 2:54 PM

## 2017-01-01 ENCOUNTER — Observation Stay (HOSPITAL_COMMUNITY): Payer: Medicare Other | Admitting: Certified Registered Nurse Anesthetist

## 2017-01-01 ENCOUNTER — Encounter (HOSPITAL_COMMUNITY): Payer: Self-pay

## 2017-01-01 ENCOUNTER — Encounter (HOSPITAL_COMMUNITY)
Admission: AD | Disposition: A | Discharge status: Home / Self Care | Payer: Self-pay | Source: Ambulatory Visit | Attending: Family Medicine

## 2017-01-01 DIAGNOSIS — K5730 Diverticulosis of large intestine without perforation or abscess without bleeding: Secondary | ICD-10-CM | POA: Diagnosis not present

## 2017-01-01 DIAGNOSIS — K2950 Unspecified chronic gastritis without bleeding: Secondary | ICD-10-CM | POA: Diagnosis not present

## 2017-01-01 DIAGNOSIS — D123 Benign neoplasm of transverse colon: Secondary | ICD-10-CM | POA: Diagnosis not present

## 2017-01-01 DIAGNOSIS — D124 Benign neoplasm of descending colon: Secondary | ICD-10-CM

## 2017-01-01 DIAGNOSIS — D500 Iron deficiency anemia secondary to blood loss (chronic): Secondary | ICD-10-CM | POA: Diagnosis not present

## 2017-01-01 DIAGNOSIS — K253 Acute gastric ulcer without hemorrhage or perforation: Secondary | ICD-10-CM | POA: Diagnosis not present

## 2017-01-01 DIAGNOSIS — D120 Benign neoplasm of cecum: Secondary | ICD-10-CM | POA: Diagnosis not present

## 2017-01-01 DIAGNOSIS — Z87442 Personal history of urinary calculi: Secondary | ICD-10-CM | POA: Diagnosis not present

## 2017-01-01 DIAGNOSIS — D649 Anemia, unspecified: Secondary | ICD-10-CM | POA: Diagnosis not present

## 2017-01-01 DIAGNOSIS — D122 Benign neoplasm of ascending colon: Secondary | ICD-10-CM | POA: Diagnosis not present

## 2017-01-01 DIAGNOSIS — K921 Melena: Secondary | ICD-10-CM | POA: Diagnosis not present

## 2017-01-01 DIAGNOSIS — D509 Iron deficiency anemia, unspecified: Secondary | ICD-10-CM | POA: Diagnosis not present

## 2017-01-01 DIAGNOSIS — R195 Other fecal abnormalities: Secondary | ICD-10-CM | POA: Diagnosis not present

## 2017-01-01 DIAGNOSIS — K259 Gastric ulcer, unspecified as acute or chronic, without hemorrhage or perforation: Secondary | ICD-10-CM | POA: Diagnosis not present

## 2017-01-01 DIAGNOSIS — H409 Unspecified glaucoma: Secondary | ICD-10-CM | POA: Diagnosis not present

## 2017-01-01 HISTORY — PX: COLONOSCOPY: SHX5424

## 2017-01-01 HISTORY — PX: ESOPHAGOGASTRODUODENOSCOPY: SHX5428

## 2017-01-01 LAB — BPAM RBC
BLOOD PRODUCT EXPIRATION DATE: 201805052359
BLOOD PRODUCT EXPIRATION DATE: 201805052359
ISSUE DATE / TIME: 201804100227
ISSUE DATE / TIME: 201804092305
UNIT TYPE AND RH: 5100
Unit Type and Rh: 5100

## 2017-01-01 LAB — TYPE AND SCREEN
ABO/RH(D): O POS
ANTIBODY SCREEN: NEGATIVE
Unit division: 0
Unit division: 0

## 2017-01-01 LAB — CBC
HEMATOCRIT: 27.7 % — AB (ref 39.0–52.0)
HEMOGLOBIN: 8.3 g/dL — AB (ref 13.0–17.0)
MCH: 21 pg — ABNORMAL LOW (ref 26.0–34.0)
MCHC: 30 g/dL (ref 30.0–36.0)
MCV: 69.9 fL — AB (ref 78.0–100.0)
PLATELETS: 369 10*3/uL (ref 150–400)
RBC: 3.96 MIL/uL — ABNORMAL LOW (ref 4.22–5.81)
RDW: 21.6 % — AB (ref 11.5–15.5)
WBC: 8.3 10*3/uL (ref 4.0–10.5)

## 2017-01-01 SURGERY — COLONOSCOPY
Anesthesia: Monitor Anesthesia Care

## 2017-01-01 MED ORDER — ACETAMINOPHEN 325 MG PO TABS: 650.0000 mg | ORAL_TABLET | Freq: Four times a day (QID) | ORAL | Status: AC | PRN

## 2017-01-01 MED ORDER — BUTAMBEN-TETRACAINE-BENZOCAINE 2-2-14 % EX AERO
INHALATION_SPRAY | CUTANEOUS | Status: DC | PRN
  Administered 2017-01-01: 1 via TOPICAL

## 2017-01-01 MED ORDER — PROPOFOL 500 MG/50ML IV EMUL
INTRAVENOUS | Status: DC | PRN
Start: 2017-01-01 — End: 2017-01-01
  Administered 2017-01-01: 50 ug/kg/min via INTRAVENOUS

## 2017-01-01 MED ORDER — LACTATED RINGERS IV SOLN
INTRAVENOUS | Status: DC | PRN
  Administered 2017-01-01: 09:00:00 via INTRAVENOUS

## 2017-01-01 MED ORDER — PANTOPRAZOLE SODIUM 40 MG PO TBEC: 40.0000 mg | DELAYED_RELEASE_TABLET | Freq: Two times a day (BID) | ORAL | Status: DC

## 2017-01-01 MED ORDER — FERROUS SULFATE 325 (65 FE) MG PO TABS: 325.0000 mg | ORAL_TABLET | Freq: Every day | ORAL | 0 refills | Status: AC

## 2017-01-01 MED ORDER — PANTOPRAZOLE SODIUM 40 MG PO TBEC: 40.0000 mg | DELAYED_RELEASE_TABLET | Freq: Two times a day (BID) | ORAL | 0 refills | Status: AC

## 2017-01-01 MED ORDER — PHENYLEPHRINE 40 MCG/ML (10ML) SYRINGE FOR IV PUSH (FOR BLOOD PRESSURE SUPPORT)
PREFILLED_SYRINGE | INTRAVENOUS | Status: DC | PRN
  Administered 2017-01-01: 40 ug via INTRAVENOUS
  Administered 2017-01-01: 80 ug via INTRAVENOUS
  Administered 2017-01-01 (×2): 40 ug via INTRAVENOUS

## 2017-01-01 MED ORDER — LIDOCAINE 2% (20 MG/ML) 5 ML SYRINGE
INTRAMUSCULAR | Status: DC | PRN
  Administered 2017-01-01: 60 mg via INTRAVENOUS

## 2017-01-01 NOTE — Transfer of Care (Addendum)
Immediate Anesthesia Transfer of Care Note  Patient: Paul Sellers  Procedure(s) Performed: Procedure(s): COLONOSCOPY (N/A) ESOPHAGOGASTRODUODENOSCOPY (EGD) (N/A)  Patient Location: Endoscopy Unit  Anesthesia Type:MAC  Level of Consciousness: awake, alert , oriented and patient cooperative  Airway & Oxygen Therapy: Patient Spontanous Breathing and Patient connected to nasal cannula oxygen  Post-op Assessment: Report given to RN, Post -op Vital signs reviewed and stable and Patient moving all extremities X 4  Post vital signs: Reviewed and stable  Last Vitals:  Vitals:   01/01/17 0609 01/01/17 0843  BP: (!) 149/76 (!) 166/77  Pulse: 69 80  Resp:  18  Temp: 36.4 C 36.9 C    Last Pain:  Vitals:   01/01/17 0843  TempSrc: Oral  PainSc:          Complications: No apparent anesthesia complications

## 2017-01-01 NOTE — Anesthesia Preprocedure Evaluation (Signed)
Anesthesia Evaluation  Patient identified by MRN, date of birth, ID band Patient awake    Reviewed: Allergy & Precautions, NPO status , Patient's Chart, lab work & pertinent test results  Airway Mallampati: II  TM Distance: >3 FB     Dental   Pulmonary neg pulmonary ROS,    breath sounds clear to auscultation       Cardiovascular negative cardio ROS   Rhythm:Regular Rate:Normal     Neuro/Psych    GI/Hepatic Neg liver ROS, History noted. CG   Endo/Other  negative endocrine ROS  Renal/GU Renal disease     Musculoskeletal   Abdominal   Peds  Hematology  (+) anemia ,   Anesthesia Other Findings   Reproductive/Obstetrics                             Anesthesia Physical Anesthesia Plan  ASA: III  Anesthesia Plan: MAC   Post-op Pain Management:    Induction: Intravenous  Airway Management Planned: Mask and Simple Face Mask  Additional Equipment:   Intra-op Plan:   Post-operative Plan:   Informed Consent: I have reviewed the patients History and Physical, chart, labs and discussed the procedure including the risks, benefits and alternatives for the proposed anesthesia with the patient or authorized representative who has indicated his/her understanding and acceptance.   Dental advisory given  Plan Discussed with: CRNA and Anesthesiologist  Anesthesia Plan Comments:         Anesthesia Quick Evaluation

## 2017-01-01 NOTE — Care Management Obs Status (Signed)
[facility] NOTIFICATION   Patient Details  Name: DELPHIN FUNES MRN: [id number] Date of Birth: March 28, 1951   Medicare Observation Status Notification Given:  Yes (pt refused to sign, stated he was told by PCP he would be admitted as an inpatient)    Sharin Mons, RN 01/01/2017, 4:37 PM

## 2017-01-01 NOTE — Op Note (Signed)
[name] Patient Name: Paul Sellers Procedure Date : 01/01/2017 MRN: [id number] Attending MD: Milus Banister , MD Date of Birth: 27-Dec-1950 CSN: [id number] Age: 66 Admit Type: Outpatient Procedure:                Upper GI endoscopy Indications:              Iron deficiency anemia, Heme positive stool Providers:                Milus Banister, MD, Vista Lawman, RN, Cherylynn Ridges, Technician Referring MD:              Medicines:                Monitored Anesthesia Care Complications:            No immediate complications. Estimated blood loss:                            None. Estimated Blood Loss:     Estimated blood loss: none. Procedure:                Pre-Anesthesia Assessment:                           - Prior to the procedure, a History and Physical                            was performed, and patient medications and                            allergies were reviewed. The patient's tolerance of                            previous anesthesia was also reviewed. The risks                            and benefits of the procedure and the sedation                            options and risks were discussed with the patient.                            All questions were answered, and informed consent                            was obtained. Prior Anticoagulants: The patient has                            taken no previous anticoagulant or antiplatelet                            agents. ASA Grade Assessment: III - A patient with  severe systemic disease. After reviewing the risks                            and benefits, the patient was deemed in                            satisfactory condition to undergo the procedure.                           After obtaining informed consent, the endoscope was                            passed under direct vision. Throughout the                            procedure, the patient's  blood pressure, pulse, and                            oxygen saturations were monitored continuously. The                            [id number] ([id number]) scope was introduced through the                            mouth, and advanced to the second part of duodenum.                            The upper GI endoscopy was accomplished without                            difficulty. The patient tolerated the procedure                            well. Scope In: Scope Out: Findings:      The esophagus was normal.      There was no blood in the UGI tract. There were two non-bleeding       cratered gastric ulcers with no stigmata of bleeding were found in the       prepyloric region of the stomach. The largest lesion was 10 mm in       largest dimension. These were not neoplastic appearing. Biopsies were       taken with a cold forceps for histology.      The examined duodenum was normal. Impression:               - Two prepyloric ulcers, not neoplastic appearing.                            Biopsies were taken to confirm and also to check                            for H. pylori Moderate Sedation:      none Recommendation:           - Return patient to hospital ward for possible  discharge same day.                           - Resume regular diet.                           - He should be sent home on twice daily PPI. Should                            avoid NSAID, ASA unless specifically needed for                            cardiac disease. If biopsies show H. pylori, he                            will be started on appropriate antibiotics.                           - Await pathology results. I will contact him when                            the results are back. At that point we will                            schedule him for repeat EGD to confirm ulcer                            healing (usually in 2 months). Procedure Code(s):        --- Professional ---                            [phone], Esophagogastroduodenoscopy, flexible,                            transoral; with biopsy, single or multiple Diagnosis Code(s):        --- Professional ---                           K25.9, Gastric ulcer, unspecified as acute or                            chronic, without hemorrhage or perforation                           D50.9, Iron deficiency anemia, unspecified                           R19.5, Other fecal abnormalities CPT copyright 2016 American Medical Association. All rights reserved. The codes documented in this report are preliminary and upon coder review may  be revised to meet current compliance requirements. Milus Banister, MD 01/01/2017 9:40:51 AM This report has been signed electronically. Number of Addenda: 0

## 2017-01-01 NOTE — Interval H&P Note (Signed)
History and Physical Interval Note:  01/01/2017 8:51 AM  Paul Sellers  has presented today for surgery, with the diagnosis of anemia.  abdominal pain.  The various methods of treatment have been discussed with the patient and family. After consideration of risks, benefits and other options for treatment, the patient has consented to  Procedure(s): COLONOSCOPY (N/A) ESOPHAGOGASTRODUODENOSCOPY (EGD) (N/A) as a surgical intervention .  The patient's history has been reviewed, patient examined, no change in status, stable for surgery.  I have reviewed the patient's chart and labs.  Questions were answered to the patient's satisfaction.     Milus Banister

## 2017-01-01 NOTE — Anesthesia Postprocedure Evaluation (Signed)
Anesthesia Post Note  Patient: Paul Sellers  Procedure(s) Performed: Procedure(s) (LRB): COLONOSCOPY (N/A) ESOPHAGOGASTRODUODENOSCOPY (EGD) (N/A)  Patient location during evaluation: PACU Anesthesia Type: MAC Level of consciousness: awake Pain management: pain level controlled Respiratory status: spontaneous breathing Cardiovascular status: stable Anesthetic complications: no       Last Vitals:  Vitals:   01/01/17 0955 01/01/17 1441  BP: 131/61 (!) 111/55  Pulse: 76 70  Resp: 17 20  Temp:  36.3 C    Last Pain:  Vitals:   01/01/17 1441  TempSrc: Oral  PainSc:                  Aleynah Rocchio

## 2017-01-01 NOTE — Progress Notes (Signed)
Pt given discharge instructions, prescriptions, and care notes. Pt verbalized understanding AEB no further questions or concerns at this time. IV was discontinued, no redness, pain, or swelling noted at this time. Telemetry discontinued and Centralized Telemetry was notified. Pt left the floor via wheelchair with staff in stable condition. 

## 2017-01-01 NOTE — Op Note (Signed)
[facility] Patient Name: Paul Sellers Procedure Date : 01/01/2017 MRN: [id number] Attending MD: Milus Banister , MD Date of Birth: 1951/02/05 CSN: [id number] Age: 66 Admit Type: Inpatient Procedure:                Colonoscopy Indications:              Heme positive stool, Iron deficiency anemia Providers:                Milus Banister, MD, Carolynn Comment, RN, Cherylynn Ridges, Technician Referring MD:              Medicines:                Monitored Anesthesia Care Complications:            No immediate complications. Estimated blood loss:                            None. Estimated Blood Loss:     Estimated blood loss: none. Procedure:                Pre-Anesthesia Assessment:                           - Prior to the procedure, a History and Physical                            was performed, and patient medications and                            allergies were reviewed. The patient's tolerance of                            previous anesthesia was also reviewed. The risks                            and benefits of the procedure and the sedation                            options and risks were discussed with the patient.                            All questions were answered, and informed consent                            was obtained. Prior Anticoagulants: The patient has                            taken no previous anticoagulant or antiplatelet                            agents. ASA Grade Assessment: III - A patient with  severe systemic disease. After reviewing the risks                            and benefits, the patient was deemed in                            satisfactory condition to undergo the procedure.                           After obtaining informed consent, the colonoscope                            was passed under direct vision. Throughout the                            procedure, the patient's  blood pressure, pulse, and                            oxygen saturations were monitored continuously. The                            [id number] ([id number]) scope was introduced through                            the anus and advanced to the the cecum, identified                            by appendiceal orifice and ileocecal valve. The                            colonoscopy was performed without difficulty. The                            patient tolerated the procedure well. The quality                            of the bowel preparation was excellent. The                            ileocecal valve, appendiceal orifice, and rectum                            were photographed. Scope In: 9:11:59 AM Scope Out: 9:24:37 AM Scope Withdrawal Time: 0 hours 10 minutes 31 seconds  Total Procedure Duration: 0 hours 12 minutes 38 seconds  Findings:      Three sessile polyps were found in the descending colon, transverse       colon and cecum. The polyps were 2 to 4 mm in size. These polyps were       removed with a cold snare. Resection and retrieval were complete.      Multiple small and large-mouthed diverticula were found in the left       colon.      The exam was otherwise without abnormality on direct and retroflexion       views. Impression:               -  Three 2 to 4 mm polyps in the descending colon,                            in the transverse colon and in the cecum, removed                            with a cold snare. Resected and retrieved.                           - Diverticulosis in the left colon.                           - The examination was otherwise normal on direct                            and retroflexion views. Moderate Sedation:      none Recommendation:           EGD now                           You will receive a letter within 2-3 weeks with the                            pathology results and my final recommendations.                           If the polyp(s) is  proven to be 'pre-cancerous' on                            pathology, you will need repeat colonoscopy in 3                            years. If the polyp(s) is NOT 'precancerous' on                            pathology then you should repeat colon cancer                            screening in 10 years with colonoscopy without need                            for colon cancer screening by any method prior to                            then (including stool testing). Procedure Code(s):        --- Professional ---                           [phone], Colonoscopy, flexible; with removal of                            tumor(s), polyp(s), or other lesion(s) by snare  technique Diagnosis Code(s):        --- Professional ---                           D12.4, Benign neoplasm of descending colon                           D12.3, Benign neoplasm of transverse colon (hepatic                            flexure or splenic flexure)                           D12.0, Benign neoplasm of cecum                           R19.5, Other fecal abnormalities                           D50.9, Iron deficiency anemia, unspecified                           K57.30, Diverticulosis of large intestine without                            perforation or abscess without bleeding CPT copyright 2016 American Medical Association. All rights reserved. The codes documented in this report are preliminary and upon coder review may  be revised to meet current compliance requirements. Milus Banister, MD 01/01/2017 9:28:19 AM This report has been signed electronically. Number of Addenda: 0

## 2017-01-01 NOTE — H&P (View-Only) (Signed)
[facility] Consult: 1:21 PM 12/31/2016  LOS: 1 day    Referring Provider: Dr Lonny Prude  Primary Care Physician:  Dr Marton Redwood at [facility] Primary Gastroenterologist:  unassigned     Reason for Consultation:  Anemia.     HPI: Paul Sellers is a 66 y.o. male.  PMH Kidney stones. s/p cystoscopy, lithotripsy.  s/p ORIF of ankle fracture.  Glaucoma, macular degeneration, s/p bil cataract surgery.  Has declined all suggestions to pursue colonoscopy in past, never submitted FOBT tests.  Fearful of testing due to claustrophobia.   2 weeks of upper abdominal pain, not exacerbated by activity or food.  + fatigue, + DOE.   No change in BMs, no blood PR or melena.  Tried gas ex, Ibuprofen (only once) which did not help.  Appetite and food tolerance preserved.  A couple of episodes of nausea, bilious emesis 3 days ago. No ETOH.  Uses goodies powders.  No PPI or H2 blocker. Started taking the [place] on his own, has never been Rxd iron or told he was anemic.   Saw PMD yesterday and sent to ED with anemia and brown, FOBT + stool.  Hgb 6. Up to 7.6 after PRBC x 2.  MCV 65.  Ferritin is 4, iron is 8.  Chemistries unremarkable. Coags normal.  TSH 5.1.  No GI imaging performed so far.     Past Medical History:  Diagnosis Date  . Kidney stones     History reviewed. No pertinent surgical history.  Prior to Admission medications   Medication Sig Start Date End Date Taking? Authorizing Provider  Iron-Vitamins (GERITOL PO) Take 1 capsule by mouth daily.   Yes Historical Provider, MD  oxymetazoline (VICKS SINEX SEVERE DECONGEST) 0.05 % nasal spray Place 1 spray into both nostrils 2 (two) times daily as needed for congestion.   Yes Historical Provider, MD  potassium citrate (UROCIT-K) 10 MEQ (1080 MG) SR tablet  Take 10 mEq by mouth 2 (two) times daily between meals.   Yes Historical Provider, MD  timolol (TIMOPTIC) 0.5 % ophthalmic solution Place 1 drop into both eyes daily.   Yes Historical Provider, MD    Scheduled Meds: . acetaminophen  650 mg Oral Once  . pantoprazole (PROTONIX) IV  40 mg Intravenous Q12H  . sodium chloride flush  3 mL Intravenous Q12H  . timolol  1 drop Both Eyes Daily   Infusions:  PRN Meds: acetaminophen **OR** acetaminophen, HYDROcodone-acetaminophen, ondansetron **OR** ondansetron (ZOFRAN) IV   Allergies as of 12/30/2016  . (No Known Allergies)    Family History  Problem Relation Age of Onset  . Stroke Father   . Prostate cancer Father   . Diabetes Sister   . CAD Neg Hx   . Hypertension Neg Hx     Social History   Social History  . Marital status: Married    Spouse name: N/A  . Number of children: N/A  . Years of education: N/A   Occupational History  . Not on file.   Social History Main Topics  .  Smoking status: Never Smoker  . Smokeless tobacco: Never Used  . Alcohol use No  . Drug use: No  . Sexual activity: Not on file   Other Topics Concern  . Not on file   Social History Narrative  . No narrative on file    REVIEW OF SYSTEMS: Constitutional:  No weakness or fatigue.  Took a hard fall onto his right side 6 months ago, bruising healed long ago.  Generally no gait disturbance.  ENT:  No nose bleeds Pulm:  No SOB or cough CV:  No palpitations, no LE edema.  GU:  No hematuria, no frequency GI:  Per HPI.  No dysphagia Heme:  No unusual bleeding or bruising   Transfusions:  None in past Neuro:  No headaches, no peripheral tingling or numbness Derm:  No itching, no rash or sores.  Endocrine:  No sweats or chills.  No polyuria or dysuria Immunization:  Not queried Travel:  None beyond local counties in last few months.    PHYSICAL EXAM: Vital signs in last 24 hours: Vitals:   12/31/16 0436 12/31/16 0523  BP: (!) 120/54 (!)  118/53  Pulse: 67 68  Resp: 18 16  Temp: 98 F (36.7 C) 98.1 F (36.7 C)   Wt Readings from Last 3 Encounters:  12/30/16 93.5 kg (206 lb 1.6 oz)    General: anxious, looks well.  comfortable Head:  No asymmetry, swelling or trauma  Eyes:  No icterus or pallor Ears:  Not HOH  Nose:  No congestion or discharge Mouth:  Good teeth.  Clear mucosa.  Good teeth Neck:  No mass, no TMG Lungs:  Clear bil.   Heart: RRR.  No mrg.  s1, s2 present Abdomen:  Soft, obese, NT.  Active BS.  No fluid wave.  No HSM.  Small upper midline muscle diastasis.   Rectal: not repeated.  FOBT + in MD office yesterday   Musc/Skeltl: no joint swelling, deformities or redness Extremities:  No CCE  Neurologic:  Alert, oriented x 3.  No tremor.  No limb weakness Skin:  No telangectasia, rash or sores Tattoos:  none Nodes:  No cervical adenopathy   Psych:  Anxious, cooperative  LAB RESULTS:  Recent Labs  12/30/16 2102 12/31/16 0837  WBC 10.6* 8.1  HGB 6.0* 7.6*  HCT 21.9* 25.9*  PLT 363 330   BMET Lab Results  Component Value Date   NA 142 12/31/2016   K 3.9 12/31/2016   CL 108 12/31/2016   CO2 26 12/31/2016   GLUCOSE 95 12/31/2016   BUN 13 12/31/2016   CREATININE 1.10 12/31/2016   CALCIUM 8.5 (L) 12/31/2016   LFT  Recent Labs  12/31/16 0837  PROT 5.7*  ALBUMIN 3.5  AST 19  ALT 16*  ALKPHOS 66  BILITOT 1.2   PT/INR Lab Results  Component Value Date   INR 1.13 12/30/2016    IMPRESSION:   *  FOBT +, iron def anemia.  Rule out neoplasia of colon or upper GI tract.  r/o ulcer, r/o AVMs.  No previous colonoscopy or EGD s/p PRBC x 2 with good response.   *  Anxiety, claustrophobia.      PLAN:     *  Colonoscopy and EGD hopefully tomorrow.     Azucena Freed  12/31/2016, 1:21 PM Pager: [phone]  ________________________________________________________________________  Rudolpho Sevin MD note:  I personally examined the patient, reviewed the data and agree with the  assessment and plan described above.  Iron def, FOBT +  significant anemia.  Planning on colonoscopy +/- EGD tomorrow.     Owens Loffler, MD [facility] Pager [phone]

## 2017-01-01 NOTE — Discharge Instructions (Signed)

## 2017-01-01 NOTE — Discharge Summary (Signed)
Physician Discharge Summary  Paul Sellers ZOX:[id number] DOB: 03/30/1951  PCP: Marton Redwood, MD  Admit date: 12/30/2016 Discharge date: 01/01/2017  Recommendations for Outpatient Follow-up:  1. Dr. Marton Redwood, PCP in 5 days with repeat labs (CBC & BMP). 2. Dr. Owens Loffler, [facility]: MDs office will arrange follow-up to go over pathology results.  Home Health: None Equipment/Devices: None    Discharge Condition: Improved and stable  CODE STATUS: Full  Diet recommendation: Heart healthy diet.  Discharge Diagnoses:  Active Problems:   Symptomatic anemia   Occult blood in stools   Benign neoplasm of transverse colon   Benign neoplasm of descending colon   Diverticulosis of colon without hemorrhage   Benign neoplasm of cecum   Acute gastric ulcer   Brief Summary: 67 year old male with PMH of kidney stones status post cystoscopy and lithotripsy, declined screening colonoscopies in the past, presented with 2 week history of upper abdominal pain without change in appetite. Reports one episode of black stools several days ago but no frank blood. Couple of episodes of nausea and bilious emesis 3 days prior to admission. Frequent use of BC powder for headaches (1-2 times daily). Progressive weakness. Seen by PCP on day of admission and noted to have brown stools, FOBT +, hemoglobin 5.8 (down from 12 in November) and sent to the hospital for further evaluation and management.  Assessment and plan:  1. Iron deficiency anemia, symptomatic: History as indicated above. Underwent GI workup. Transfused 2 units of PRBCs. Hemoglobin improved from 6 g on admission to 8.3 and stable. No further history suggestive of overt bleeding. Discussed with [facility], cleared from their perspective for discharge home on iron supplements once daily (this can be increased as patient tolerates and based on hemoglobin response). Follow CBCs in a few days as outpatient. Anemia panel: Results as below. 2. Occult  GI bleed/FOBT +/prepyloric ulcers: [facility] was consulted. Patient underwent EGD and colonoscopy on 4/11 with details as below. EGD showed 2 prepyloric ulcers, not neoplastic-appearing. As discussed with [facility] and as per their recommendations, resumed regular diet which she has tolerated, okay to DC home on twice a day PPI, avoid NSAIDs. GI will arrange outpatient follow-up with results of H. pylori and pathology and recommend repeating EGD to confirm ulcer healing in 2 months. 3. Colonic polyps: Three colonic polyps were removed during colonoscopy. GI will follow-up pathologies with patient as outpatient. 4. Abnormal TSH/mildly elevated: Clinically euthyroid. Consider repeating TSH in 4-6 weeks.  Consultations:  [facility]  Procedures:  EGD 01/01/17: Impression:               - Two prepyloric ulcers, not neoplastic appearing.                            Biopsies were taken to confirm and also to check                            for H. pylori Moderate Sedation:      none Recommendation:           - Return patient to hospital ward for possible                            discharge same day.                           -  Resume regular diet.                           - He should be sent home on twice daily PPI. Should                            avoid NSAID, ASA unless specifically needed for                            cardiac disease. If biopsies show H. pylori, he                            will be started on appropriate antibiotics.                           - Await pathology results. I will contact him when                            the results are back. At that point we will                            schedule him for repeat EGD to confirm ulcer                            healing (usually in 2 months).   Colonoscopy 01/01/17: Impression:               - Three 2 to 4 mm polyps in the descending colon,                            in the transverse colon and in the cecum, removed                             with a cold snare. Resected and retrieved.                           - Diverticulosis in the left colon.                           - The examination was otherwise normal on direct                            and retroflexion views.  Discharge Instructions  Discharge Instructions    Call MD for:  difficulty breathing, headache or visual disturbances    Complete by:  As directed    Call MD for:  extreme fatigue    Complete by:  As directed    Call MD for:  persistant dizziness or light-headedness    Complete by:  As directed    Call MD for:  severe uncontrolled pain    Complete by:  As directed    Diet - low sodium heart healthy    Complete by:  As directed    Increase activity slowly    Complete by:  As directed        Medication List  STOP taking these medications   GERITOL PO     TAKE these medications   acetaminophen 325 MG tablet Commonly known as:  TYLENOL Take 2 tablets (650 mg total) by mouth every 6 (six) hours as needed for mild pain, moderate pain, fever or headache (or Fever >/= 101).   ferrous sulfate 325 (65 FE) MG tablet Take 1 tablet (325 mg total) by mouth daily with breakfast.   pantoprazole 40 MG tablet Commonly known as:  PROTONIX Take 1 tablet (40 mg total) by mouth 2 (two) times daily before a meal.   potassium citrate 10 MEQ (1080 MG) SR tablet Commonly known as:  UROCIT-K Take 10 mEq by mouth 2 (two) times daily between meals.   timolol 0.5 % ophthalmic solution Commonly known as:  TIMOPTIC Place 1 drop into both eyes daily.   VICKS SINEX SEVERE DECONGEST 0.05 % nasal spray Generic drug:  oxymetazoline Place 1 spray into both nostrils 2 (two) times daily as needed for congestion.      Follow-up Information    Marton Redwood, MD Follow up in 5 day(s).   Specialty:  Internal Medicine Why:  To be seen with repeat labs (CBC & BMP). Contact information: [place] [address] [phone]         Milus Banister, MD Follow up.   Specialty:  Gastroenterology Why:  MDs office will arrange follow-up. Contact information: [address] [address] [phone]          No Known Allergies    Procedures/Studies: No results found.    Subjective: Seen after procedures this morning. Denies complaints. Anxious to go home. Denies dizziness, lightheadedness, headache, chest pain, dyspnea, nausea, vomiting, abdominal pain.  Discharge Exam:  Vitals:   01/01/17 0944 01/01/17 0945 01/01/17 0955 01/01/17 1441  BP: 112/60 112/60 131/61 (!) 111/55  Pulse: 85 80 76 70  Resp: 18 18 17 20   Temp: 97.9 F (36.6 C)   97.3 F (36.3 C)  TempSrc: Oral   Oral  SpO2: 100% 100% 99% 100%  Weight:      Height:        General: Pt lying comfortably in bed & appears in no obvious distress. Cardiovascular: S1 & S2 heard, RRR, S1/S2 +. No murmurs, rubs, gallops or clicks. No JVD or pedal edema.Telemetry: Sinus rhythm. Respiratory: Clear to auscultation without wheezing, rhonchi or crackles. No increased work of breathing. Abdominal:  Non distended, non tender & soft. No organomegaly or masses appreciated. Normal bowel sounds heard. CNS: Alert and oriented. No focal deficits. Extremities: no edema, no cyanosis    The results of significant diagnostics from this hospitalization (including imaging, microbiology, ancillary and laboratory) are listed below for reference.     Microbiology: No results found for this or any previous visit (from the past 240 hour(s)).   Labs: CBC:  Recent Labs Lab 12/30/16 2102 12/31/16 0837 12/31/16 1403 01/01/17 0544  WBC 10.6* 8.1 8.7 8.3  HGB 6.0* 7.6* 8.3* 8.3*  HCT 21.9* 25.9* 27.7* 27.7*  MCV 65.6* 70.0* 70.1* 69.9*  PLT 363 330 321 505   Basic Metabolic Panel:  Recent Labs Lab 12/31/16 0837  NA 142  K 3.9  CL 108  CO2 26  GLUCOSE 95  BUN 13  CREATININE 1.10  CALCIUM 8.5*  MG 2.0  PHOS 3.2   Liver Function  Tests:  Recent Labs Lab 12/31/16 0837  AST 19  ALT 16*  ALKPHOS 66  BILITOT 1.2  PROT 5.7*  ALBUMIN 3.5    Thyroid function studies  Recent Labs  12/31/16 0827  TSH 5.191*   Anemia work up  Recent Labs  12/30/16 2102  VITAMINB12 311  FOLATE 27.1  FERRITIN 4*  TIBC 577*  IRON 8*  RETICCTPCT 2.5   Discussed in detail with patient's spouse at bedside. Updated care and answered questions.  Time coordinating discharge: Over 30 minutes  SIGNED:  Vernell Leep, MD, FACP, [place]. [facility] Pager [phone]  If 7PM-7AM, please contact night-coverage [email or web address] Password [facility] 01/01/2017, 3:47 PM

## 2017-01-03 ENCOUNTER — Telehealth: Payer: Self-pay | Admitting: Gastroenterology

## 2017-01-03 ENCOUNTER — Other Ambulatory Visit: Payer: Self-pay

## 2017-01-03 ENCOUNTER — Encounter (HOSPITAL_COMMUNITY): Payer: Self-pay | Admitting: Gastroenterology

## 2017-01-03 DIAGNOSIS — D649 Anemia, unspecified: Secondary | ICD-10-CM

## 2017-01-03 NOTE — Telephone Encounter (Signed)
I spoke with the pt and discussed path results and also advised he could try OTC omeprazole and call back if problems persist

## 2017-01-06 DIAGNOSIS — K259 Gastric ulcer, unspecified as acute or chronic, without hemorrhage or perforation: Secondary | ICD-10-CM | POA: Diagnosis not present

## 2017-01-20 DIAGNOSIS — K922 Gastrointestinal hemorrhage, unspecified: Secondary | ICD-10-CM | POA: Diagnosis not present

## 2017-01-30 DIAGNOSIS — D509 Iron deficiency anemia, unspecified: Secondary | ICD-10-CM | POA: Diagnosis not present

## 2017-01-30 DIAGNOSIS — K259 Gastric ulcer, unspecified as acute or chronic, without hemorrhage or perforation: Secondary | ICD-10-CM | POA: Diagnosis not present

## 2017-01-30 DIAGNOSIS — K922 Gastrointestinal hemorrhage, unspecified: Secondary | ICD-10-CM | POA: Diagnosis not present

## 2017-01-30 DIAGNOSIS — R0609 Other forms of dyspnea: Secondary | ICD-10-CM | POA: Diagnosis not present

## 2017-01-31 ENCOUNTER — Telehealth: Payer: Self-pay

## 2017-01-31 NOTE — Telephone Encounter (Signed)
Left message on machine to call back  

## 2017-01-31 NOTE — Telephone Encounter (Signed)
-----   Message from Jeoffrey Massed, RN sent at 01/03/2017  1:09 PM EDT ----- repeat cbc in 4 weeks and also repeat EGD in 2 months ([place]) to confirm healing

## 2017-02-04 NOTE — Telephone Encounter (Signed)
Left message on machine to call back  

## 2017-02-05 ENCOUNTER — Telehealth: Payer: Self-pay | Admitting: Gastroenterology

## 2017-02-05 NOTE — Telephone Encounter (Signed)
The pt states he does not want to have any testing at this time.  He states he has had labs at [facility] and his Hgb was "10"  at his last visit.  He states he will have his labs sent here for review and call back if his labs decrease.  Forwarded to Dr Ardis Hughs for review

## 2017-02-05 NOTE — Telephone Encounter (Signed)
Pt has been advised of the importance of the EGD, he continues to decline to schedule at this time.  He will call back later

## 2017-02-05 NOTE — Telephone Encounter (Signed)
Ok, please call him back and let him know that repeat EGD is standard of care for gastric ulcers to "make sure there is no cancer causing the ulcer."

## 2017-03-04 DIAGNOSIS — D508 Other iron deficiency anemias: Secondary | ICD-10-CM | POA: Diagnosis not present

## 2017-03-04 DIAGNOSIS — D509 Iron deficiency anemia, unspecified: Secondary | ICD-10-CM | POA: Diagnosis not present

## 2017-05-07 DIAGNOSIS — H26493 Other secondary cataract, bilateral: Secondary | ICD-10-CM | POA: Diagnosis not present

## 2017-05-07 DIAGNOSIS — H524 Presbyopia: Secondary | ICD-10-CM | POA: Diagnosis not present

## 2017-05-07 DIAGNOSIS — H401232 Low-tension glaucoma, bilateral, moderate stage: Secondary | ICD-10-CM | POA: Diagnosis not present

## 2017-06-02 DIAGNOSIS — R972 Elevated prostate specific antigen [PSA]: Secondary | ICD-10-CM | POA: Diagnosis not present

## 2017-06-02 DIAGNOSIS — N200 Calculus of kidney: Secondary | ICD-10-CM | POA: Diagnosis not present

## 2017-06-02 DIAGNOSIS — R3912 Poor urinary stream: Secondary | ICD-10-CM | POA: Diagnosis not present

## 2017-06-02 DIAGNOSIS — N401 Enlarged prostate with lower urinary tract symptoms: Secondary | ICD-10-CM | POA: Diagnosis not present

## 2017-08-20 DIAGNOSIS — I10 Essential (primary) hypertension: Secondary | ICD-10-CM | POA: Diagnosis not present

## 2017-08-20 DIAGNOSIS — E038 Other specified hypothyroidism: Secondary | ICD-10-CM | POA: Diagnosis not present

## 2017-08-20 DIAGNOSIS — Z125 Encounter for screening for malignant neoplasm of prostate: Secondary | ICD-10-CM | POA: Diagnosis not present

## 2017-08-20 DIAGNOSIS — E7849 Other hyperlipidemia: Secondary | ICD-10-CM | POA: Diagnosis not present

## 2017-08-20 DIAGNOSIS — R82998 Other abnormal findings in urine: Secondary | ICD-10-CM | POA: Diagnosis not present

## 2017-08-27 DIAGNOSIS — D508 Other iron deficiency anemias: Secondary | ICD-10-CM | POA: Diagnosis not present

## 2017-08-27 DIAGNOSIS — Z8719 Personal history of other diseases of the digestive system: Secondary | ICD-10-CM | POA: Diagnosis not present

## 2017-08-27 DIAGNOSIS — Z0000 Encounter for general adult medical examination without abnormal findings: Secondary | ICD-10-CM | POA: Diagnosis not present

## 2017-08-27 DIAGNOSIS — E7849 Other hyperlipidemia: Secondary | ICD-10-CM | POA: Diagnosis not present

## 2017-08-27 DIAGNOSIS — Z86010 Personal history of colonic polyps: Secondary | ICD-10-CM | POA: Diagnosis not present

## 2017-08-27 DIAGNOSIS — E038 Other specified hypothyroidism: Secondary | ICD-10-CM | POA: Diagnosis not present

## 2017-08-27 DIAGNOSIS — Z6836 Body mass index (BMI) 36.0-36.9, adult: Secondary | ICD-10-CM | POA: Diagnosis not present

## 2017-08-27 DIAGNOSIS — I10 Essential (primary) hypertension: Secondary | ICD-10-CM | POA: Diagnosis not present

## 2017-08-27 DIAGNOSIS — R972 Elevated prostate specific antigen [PSA]: Secondary | ICD-10-CM | POA: Diagnosis not present

## 2017-08-27 DIAGNOSIS — E668 Other obesity: Secondary | ICD-10-CM | POA: Diagnosis not present

## 2017-09-11 DIAGNOSIS — H26491 Other secondary cataract, right eye: Secondary | ICD-10-CM | POA: Diagnosis not present

## 2017-10-23 DIAGNOSIS — H26492 Other secondary cataract, left eye: Secondary | ICD-10-CM | POA: Diagnosis not present

## 2018-05-11 DIAGNOSIS — H401132 Primary open-angle glaucoma, bilateral, moderate stage: Secondary | ICD-10-CM | POA: Diagnosis not present

## 2018-06-03 DIAGNOSIS — R3912 Poor urinary stream: Secondary | ICD-10-CM | POA: Diagnosis not present

## 2018-06-03 DIAGNOSIS — N401 Enlarged prostate with lower urinary tract symptoms: Secondary | ICD-10-CM | POA: Diagnosis not present

## 2018-06-03 DIAGNOSIS — N200 Calculus of kidney: Secondary | ICD-10-CM | POA: Diagnosis not present

## 2018-08-27 DIAGNOSIS — E7849 Other hyperlipidemia: Secondary | ICD-10-CM | POA: Diagnosis not present

## 2018-08-27 DIAGNOSIS — R82998 Other abnormal findings in urine: Secondary | ICD-10-CM | POA: Diagnosis not present

## 2018-08-27 DIAGNOSIS — I10 Essential (primary) hypertension: Secondary | ICD-10-CM | POA: Diagnosis not present

## 2018-08-27 DIAGNOSIS — E038 Other specified hypothyroidism: Secondary | ICD-10-CM | POA: Diagnosis not present

## 2018-08-27 DIAGNOSIS — Z125 Encounter for screening for malignant neoplasm of prostate: Secondary | ICD-10-CM | POA: Diagnosis not present

## 2018-09-03 DIAGNOSIS — E038 Other specified hypothyroidism: Secondary | ICD-10-CM | POA: Diagnosis not present

## 2018-09-03 DIAGNOSIS — I10 Essential (primary) hypertension: Secondary | ICD-10-CM | POA: Diagnosis not present

## 2018-09-03 DIAGNOSIS — Z0000 Encounter for general adult medical examination without abnormal findings: Secondary | ICD-10-CM | POA: Diagnosis not present

## 2018-09-03 DIAGNOSIS — E7849 Other hyperlipidemia: Secondary | ICD-10-CM | POA: Diagnosis not present

## 2018-11-11 DIAGNOSIS — Z961 Presence of intraocular lens: Secondary | ICD-10-CM | POA: Diagnosis not present

## 2018-11-11 DIAGNOSIS — H401132 Primary open-angle glaucoma, bilateral, moderate stage: Secondary | ICD-10-CM | POA: Diagnosis not present

## 2019-05-12 DIAGNOSIS — H401132 Primary open-angle glaucoma, bilateral, moderate stage: Secondary | ICD-10-CM | POA: Diagnosis not present

## 2019-08-30 DIAGNOSIS — E7849 Other hyperlipidemia: Secondary | ICD-10-CM | POA: Diagnosis not present

## 2019-08-30 DIAGNOSIS — Z125 Encounter for screening for malignant neoplasm of prostate: Secondary | ICD-10-CM | POA: Diagnosis not present

## 2019-08-30 DIAGNOSIS — E038 Other specified hypothyroidism: Secondary | ICD-10-CM | POA: Diagnosis not present

## 2019-09-06 DIAGNOSIS — I10 Essential (primary) hypertension: Secondary | ICD-10-CM | POA: Diagnosis not present

## 2019-09-06 DIAGNOSIS — E039 Hypothyroidism, unspecified: Secondary | ICD-10-CM | POA: Diagnosis not present

## 2019-09-06 DIAGNOSIS — E785 Hyperlipidemia, unspecified: Secondary | ICD-10-CM | POA: Diagnosis not present

## 2019-09-06 DIAGNOSIS — R82998 Other abnormal findings in urine: Secondary | ICD-10-CM | POA: Diagnosis not present

## 2019-09-06 DIAGNOSIS — Z0000 Encounter for general adult medical examination without abnormal findings: Secondary | ICD-10-CM | POA: Diagnosis not present

## 2019-11-09 DIAGNOSIS — H52223 Regular astigmatism, bilateral: Secondary | ICD-10-CM | POA: Diagnosis not present

## 2019-11-19 ENCOUNTER — Ambulatory Visit: Payer: MEDICAID | Attending: Internal Medicine

## 2019-11-19 DIAGNOSIS — Z23 Encounter for immunization: Secondary | ICD-10-CM

## 2019-11-19 NOTE — Progress Notes (Signed)
   Covid-19 Vaccination Clinic  Name:  Paul Sellers    MRN: [id number] DOB: 1950-11-06  11/19/2019  Paul Sellers was observed post Covid-19 immunization for 15 minutes without incidence. He was provided with Vaccine Information Sheet and instruction to access the V-Safe system.   Paul Sellers was instructed to call 911 with any severe reactions post vaccine: Marland Kitchen Difficulty breathing  . Swelling of your face and throat  . A fast heartbeat  . A bad rash all over your body  . Dizziness and weakness    Immunizations Administered    Name Date Dose VIS Date Route   Pfizer COVID-19 Vaccine 11/19/2019 10:23 AM 0.3 mL 09/03/2019 Intramuscular   Manufacturer: [place]   Lot: [id number]   [place]: [id number]

## 2019-12-14 ENCOUNTER — Ambulatory Visit: Payer: MEDICAID | Attending: Internal Medicine

## 2019-12-14 DIAGNOSIS — Z23 Encounter for immunization: Secondary | ICD-10-CM

## 2019-12-14 NOTE — Progress Notes (Signed)
   Covid-19 Vaccination Clinic  Name:  Paul Sellers    MRN: [id number] DOB: 07-24-1951  12/14/2019  [name] was observed post Covid-19 immunization for 15 minutes without incident. He was provided with Vaccine Information Sheet and instruction to access the V-Safe system.   [name] was instructed to call 911 with any severe reactions post vaccine: Marland Kitchen Difficulty breathing  . Swelling of face and throat  . A fast heartbeat  . A bad rash all over body  . Dizziness and weakness   Immunizations Administered    Name Date Dose VIS Date Route   Pfizer COVID-19 Vaccine 12/14/2019 12:12 PM 0.3 mL 09/03/2019 Intramuscular   Manufacturer: [place]   Lot: [id number]   [place]: [id number]

## 2020-04-27 DIAGNOSIS — Z0120 Encounter for dental examination and cleaning without abnormal findings: Secondary | ICD-10-CM | POA: Diagnosis not present

## 2020-05-31 DIAGNOSIS — H401131 Primary open-angle glaucoma, bilateral, mild stage: Secondary | ICD-10-CM | POA: Diagnosis not present

## 2020-05-31 DIAGNOSIS — Z961 Presence of intraocular lens: Secondary | ICD-10-CM | POA: Diagnosis not present

## 2020-06-05 DIAGNOSIS — M1712 Unilateral primary osteoarthritis, left knee: Secondary | ICD-10-CM | POA: Diagnosis not present

## 2020-06-05 DIAGNOSIS — M25562 Pain in left knee: Secondary | ICD-10-CM | POA: Diagnosis not present

## 2020-07-03 DIAGNOSIS — H52223 Regular astigmatism, bilateral: Secondary | ICD-10-CM | POA: Diagnosis not present

## 2020-09-06 DIAGNOSIS — E039 Hypothyroidism, unspecified: Secondary | ICD-10-CM | POA: Diagnosis not present

## 2020-09-06 DIAGNOSIS — E785 Hyperlipidemia, unspecified: Secondary | ICD-10-CM | POA: Diagnosis not present

## 2020-09-06 DIAGNOSIS — Z125 Encounter for screening for malignant neoplasm of prostate: Secondary | ICD-10-CM | POA: Diagnosis not present

## 2020-09-13 DIAGNOSIS — R82998 Other abnormal findings in urine: Secondary | ICD-10-CM | POA: Diagnosis not present

## 2020-09-13 DIAGNOSIS — I10 Essential (primary) hypertension: Secondary | ICD-10-CM | POA: Diagnosis not present

## 2020-12-20 DIAGNOSIS — Z961 Presence of intraocular lens: Secondary | ICD-10-CM | POA: Diagnosis not present

## 2020-12-20 DIAGNOSIS — H401131 Primary open-angle glaucoma, bilateral, mild stage: Secondary | ICD-10-CM | POA: Diagnosis not present

## 2021-06-27 DIAGNOSIS — H401122 Primary open-angle glaucoma, left eye, moderate stage: Secondary | ICD-10-CM | POA: Diagnosis not present

## 2021-06-27 DIAGNOSIS — H401111 Primary open-angle glaucoma, right eye, mild stage: Secondary | ICD-10-CM | POA: Diagnosis not present

## 2021-10-17 DIAGNOSIS — I10 Essential (primary) hypertension: Secondary | ICD-10-CM | POA: Diagnosis not present

## 2021-10-17 DIAGNOSIS — E039 Hypothyroidism, unspecified: Secondary | ICD-10-CM | POA: Diagnosis not present

## 2021-10-17 DIAGNOSIS — Z125 Encounter for screening for malignant neoplasm of prostate: Secondary | ICD-10-CM | POA: Diagnosis not present

## 2021-10-17 DIAGNOSIS — E785 Hyperlipidemia, unspecified: Secondary | ICD-10-CM | POA: Diagnosis not present

## 2021-10-24 DIAGNOSIS — R82998 Other abnormal findings in urine: Secondary | ICD-10-CM | POA: Diagnosis not present

## 2022-01-09 DIAGNOSIS — H35373 Puckering of macula, bilateral: Secondary | ICD-10-CM | POA: Diagnosis not present

## 2022-01-09 DIAGNOSIS — H401111 Primary open-angle glaucoma, right eye, mild stage: Secondary | ICD-10-CM | POA: Diagnosis not present

## 2022-01-09 DIAGNOSIS — H401122 Primary open-angle glaucoma, left eye, moderate stage: Secondary | ICD-10-CM | POA: Diagnosis not present

## 2022-01-09 DIAGNOSIS — Z961 Presence of intraocular lens: Secondary | ICD-10-CM | POA: Diagnosis not present

## 2022-01-09 DIAGNOSIS — M1712 Unilateral primary osteoarthritis, left knee: Secondary | ICD-10-CM | POA: Diagnosis not present

## 2022-07-09 DIAGNOSIS — M170 Bilateral primary osteoarthritis of knee: Secondary | ICD-10-CM | POA: Diagnosis not present

## 2022-07-09 DIAGNOSIS — M1711 Unilateral primary osteoarthritis, right knee: Secondary | ICD-10-CM | POA: Diagnosis not present

## 2022-07-09 DIAGNOSIS — M1712 Unilateral primary osteoarthritis, left knee: Secondary | ICD-10-CM | POA: Diagnosis not present

## 2022-07-09 DIAGNOSIS — M25461 Effusion, right knee: Secondary | ICD-10-CM | POA: Diagnosis not present

## 2022-07-19 DIAGNOSIS — H401132 Primary open-angle glaucoma, bilateral, moderate stage: Secondary | ICD-10-CM | POA: Diagnosis not present

## 2022-08-08 DIAGNOSIS — M25561 Pain in right knee: Secondary | ICD-10-CM | POA: Diagnosis not present

## 2022-10-15 DIAGNOSIS — K080 Exfoliation of teeth due to systemic causes: Secondary | ICD-10-CM | POA: Diagnosis not present

## 2022-11-04 DIAGNOSIS — E039 Hypothyroidism, unspecified: Secondary | ICD-10-CM | POA: Diagnosis not present

## 2022-11-04 DIAGNOSIS — I10 Essential (primary) hypertension: Secondary | ICD-10-CM | POA: Diagnosis not present

## 2022-11-04 DIAGNOSIS — E785 Hyperlipidemia, unspecified: Secondary | ICD-10-CM | POA: Diagnosis not present

## 2022-11-04 DIAGNOSIS — Z125 Encounter for screening for malignant neoplasm of prostate: Secondary | ICD-10-CM | POA: Diagnosis not present

## 2022-11-11 DIAGNOSIS — I10 Essential (primary) hypertension: Secondary | ICD-10-CM | POA: Diagnosis not present

## 2022-11-11 DIAGNOSIS — Z0000 Encounter for general adult medical examination without abnormal findings: Secondary | ICD-10-CM | POA: Diagnosis not present

## 2022-11-11 DIAGNOSIS — R82998 Other abnormal findings in urine: Secondary | ICD-10-CM | POA: Diagnosis not present

## 2022-11-11 DIAGNOSIS — E039 Hypothyroidism, unspecified: Secondary | ICD-10-CM | POA: Diagnosis not present

## 2022-11-11 DIAGNOSIS — E785 Hyperlipidemia, unspecified: Secondary | ICD-10-CM | POA: Diagnosis not present

## 2022-11-11 DIAGNOSIS — Z1339 Encounter for screening examination for other mental health and behavioral disorders: Secondary | ICD-10-CM | POA: Diagnosis not present

## 2022-11-11 DIAGNOSIS — I868 Varicose veins of other specified sites: Secondary | ICD-10-CM | POA: Diagnosis not present

## 2022-11-11 DIAGNOSIS — Z1331 Encounter for screening for depression: Secondary | ICD-10-CM | POA: Diagnosis not present

## 2023-01-24 DIAGNOSIS — Z961 Presence of intraocular lens: Secondary | ICD-10-CM | POA: Diagnosis not present

## 2023-01-24 DIAGNOSIS — H401132 Primary open-angle glaucoma, bilateral, moderate stage: Secondary | ICD-10-CM | POA: Diagnosis not present

## 2023-06-11 DIAGNOSIS — K080 Exfoliation of teeth due to systemic causes: Secondary | ICD-10-CM | POA: Diagnosis not present

## 2023-06-18 DIAGNOSIS — K080 Exfoliation of teeth due to systemic causes: Secondary | ICD-10-CM | POA: Diagnosis not present

## 2023-08-06 DIAGNOSIS — K080 Exfoliation of teeth due to systemic causes: Secondary | ICD-10-CM | POA: Diagnosis not present

## 2023-08-13 DIAGNOSIS — H02403 Unspecified ptosis of bilateral eyelids: Secondary | ICD-10-CM | POA: Diagnosis not present

## 2023-08-13 DIAGNOSIS — H401132 Primary open-angle glaucoma, bilateral, moderate stage: Secondary | ICD-10-CM | POA: Diagnosis not present

## 2023-09-04 DIAGNOSIS — H524 Presbyopia: Secondary | ICD-10-CM | POA: Diagnosis not present

## 2023-10-20 DIAGNOSIS — M1712 Unilateral primary osteoarthritis, left knee: Secondary | ICD-10-CM | POA: Diagnosis not present

## 2023-10-20 DIAGNOSIS — M170 Bilateral primary osteoarthritis of knee: Secondary | ICD-10-CM | POA: Diagnosis not present

## 2023-10-20 DIAGNOSIS — M1711 Unilateral primary osteoarthritis, right knee: Secondary | ICD-10-CM | POA: Diagnosis not present

## 2023-11-14 DIAGNOSIS — E785 Hyperlipidemia, unspecified: Secondary | ICD-10-CM | POA: Diagnosis not present

## 2023-11-17 DIAGNOSIS — Z125 Encounter for screening for malignant neoplasm of prostate: Secondary | ICD-10-CM | POA: Diagnosis not present

## 2023-11-17 DIAGNOSIS — Z1389 Encounter for screening for other disorder: Secondary | ICD-10-CM | POA: Diagnosis not present

## 2023-11-17 DIAGNOSIS — E039 Hypothyroidism, unspecified: Secondary | ICD-10-CM | POA: Diagnosis not present

## 2023-11-17 DIAGNOSIS — E785 Hyperlipidemia, unspecified: Secondary | ICD-10-CM | POA: Diagnosis not present

## 2023-11-21 DIAGNOSIS — Z1331 Encounter for screening for depression: Secondary | ICD-10-CM | POA: Diagnosis not present

## 2023-11-21 DIAGNOSIS — I10 Essential (primary) hypertension: Secondary | ICD-10-CM | POA: Diagnosis not present

## 2023-11-21 DIAGNOSIS — Z1339 Encounter for screening examination for other mental health and behavioral disorders: Secondary | ICD-10-CM | POA: Diagnosis not present

## 2023-11-21 DIAGNOSIS — R82998 Other abnormal findings in urine: Secondary | ICD-10-CM | POA: Diagnosis not present

## 2023-11-21 DIAGNOSIS — R0981 Nasal congestion: Secondary | ICD-10-CM | POA: Diagnosis not present

## 2023-11-21 DIAGNOSIS — Z0000 Encounter for general adult medical examination without abnormal findings: Secondary | ICD-10-CM | POA: Diagnosis not present

## 2024-01-02 DIAGNOSIS — N200 Calculus of kidney: Secondary | ICD-10-CM | POA: Diagnosis not present

## 2024-01-02 DIAGNOSIS — R3912 Poor urinary stream: Secondary | ICD-10-CM | POA: Diagnosis not present

## 2024-01-02 DIAGNOSIS — R972 Elevated prostate specific antigen [PSA]: Secondary | ICD-10-CM | POA: Diagnosis not present

## 2024-01-02 DIAGNOSIS — N401 Enlarged prostate with lower urinary tract symptoms: Secondary | ICD-10-CM | POA: Diagnosis not present

## 2024-02-11 DIAGNOSIS — H401132 Primary open-angle glaucoma, bilateral, moderate stage: Secondary | ICD-10-CM | POA: Diagnosis not present

## 2024-02-11 DIAGNOSIS — Z961 Presence of intraocular lens: Secondary | ICD-10-CM | POA: Diagnosis not present

## 2024-02-11 DIAGNOSIS — H35373 Puckering of macula, bilateral: Secondary | ICD-10-CM | POA: Diagnosis not present

## 2024-04-13 DIAGNOSIS — K080 Exfoliation of teeth due to systemic causes: Secondary | ICD-10-CM | POA: Diagnosis not present

## 2024-04-29 DIAGNOSIS — R972 Elevated prostate specific antigen [PSA]: Secondary | ICD-10-CM | POA: Diagnosis not present

## 2024-05-06 DIAGNOSIS — R972 Elevated prostate specific antigen [PSA]: Secondary | ICD-10-CM | POA: Diagnosis not present

## 2024-05-06 DIAGNOSIS — R3912 Poor urinary stream: Secondary | ICD-10-CM | POA: Diagnosis not present

## 2024-05-06 DIAGNOSIS — N200 Calculus of kidney: Secondary | ICD-10-CM | POA: Diagnosis not present

## 2024-05-06 DIAGNOSIS — N401 Enlarged prostate with lower urinary tract symptoms: Secondary | ICD-10-CM | POA: Diagnosis not present

## 2024-07-13 DIAGNOSIS — K080 Exfoliation of teeth due to systemic causes: Secondary | ICD-10-CM | POA: Diagnosis not present

## 2024-07-15 NOTE — Progress Notes (Signed)
 KEIR VIERNES                                          MRN: [id number]   07/15/2024   The VBCI Quality Team Specialist reviewed this patient medical record for the purposes of chart review for care gap closure. The following were reviewed: chart review for care gap closure-controlling blood pressure.    VBCI Quality Team

## 2024-08-10 DIAGNOSIS — H401132 Primary open-angle glaucoma, bilateral, moderate stage: Secondary | ICD-10-CM | POA: Diagnosis not present
